# Patient Record
Sex: Female | Born: 1945 | Race: White | Hispanic: No
Health system: Northeastern US, Academic
[De-identification: ages and names within clinical notes are randomized; demographics above are authoritative.]

## PROBLEM LIST (undated history)

## (undated) DIAGNOSIS — I951 Orthostatic hypotension: Secondary | ICD-10-CM

## (undated) DIAGNOSIS — M545 Low back pain, unspecified: Secondary | ICD-10-CM

## (undated) DIAGNOSIS — I639 Cerebral infarction, unspecified: Secondary | ICD-10-CM

## (undated) DIAGNOSIS — F329 Major depressive disorder, single episode, unspecified: Secondary | ICD-10-CM

## (undated) DIAGNOSIS — M7660 Achilles tendinitis, unspecified leg: Secondary | ICD-10-CM

## (undated) DIAGNOSIS — K5790 Diverticulosis of intestine, part unspecified, without perforation or abscess without bleeding: Secondary | ICD-10-CM

## (undated) DIAGNOSIS — E785 Hyperlipidemia, unspecified: Secondary | ICD-10-CM

## (undated) DIAGNOSIS — R011 Cardiac murmur, unspecified: Secondary | ICD-10-CM

## (undated) DIAGNOSIS — I059 Rheumatic mitral valve disease, unspecified: Secondary | ICD-10-CM

## (undated) DIAGNOSIS — F32A Depression, unspecified: Secondary | ICD-10-CM

## (undated) DIAGNOSIS — K219 Gastro-esophageal reflux disease without esophagitis: Secondary | ICD-10-CM

## (undated) DIAGNOSIS — G709 Myoneural disorder, unspecified: Secondary | ICD-10-CM

## (undated) DIAGNOSIS — Z8619 Personal history of other infectious and parasitic diseases: Secondary | ICD-10-CM

## (undated) HISTORY — DX: Low back pain, unspecified: M54.50

## (undated) HISTORY — DX: Orthostatic hypotension: I95.1

## (undated) HISTORY — DX: Low back pain: M54.5

## (undated) HISTORY — DX: Achilles tendinitis, unspecified leg: M76.60

## (undated) HISTORY — DX: Personal history of other infectious and parasitic diseases: Z86.19

## (undated) HISTORY — DX: Myoneural disorder, unspecified: G70.9

## (undated) HISTORY — DX: Cardiac murmur, unspecified: R01.1

## (undated) HISTORY — DX: Hyperlipidemia, unspecified: E78.5

## (undated) HISTORY — DX: Diverticulosis of intestine, part unspecified, without perforation or abscess without bleeding: K57.90

## (undated) HISTORY — DX: Gastro-esophageal reflux disease without esophagitis: K21.9

## (undated) HISTORY — DX: Major depressive disorder, single episode, unspecified: F32.9

## (undated) HISTORY — DX: Depression, unspecified: F32.A

## (undated) HISTORY — DX: Rheumatic mitral valve disease, unspecified: I05.9

## (undated) HISTORY — DX: Cerebral infarction, unspecified: I63.9

## (undated) HISTORY — PX: TONSILLECTOMY: SHX5217

## (undated) NOTE — Progress Notes (Signed)
Formatting of this note might be different from the original.  Patient was seen in the office today for a blood draw  2 patient identifiers were used prior to venipuncture  Electronically signed by Rudie Meyer, CMA at 11/17/2021  5:44 PM EDT

## (undated) NOTE — Progress Notes (Signed)
Formatting of this note is different from the original.    Assessment and Plan   1. Recurrent major depressive disorder, in partial remission (*) (Primary)  -     LEXAPRO 20 MG tablet; Take one tablet (20 mg dose) by mouth daily., Starting Wed 11/17/2021, Normal  2. Idiopathic gout, unspecified chronicity, unspecified site  -     allopurinol (ZYLOPRIM) 300 mg tablet; Take one tablet (300 mg dose) by mouth daily., Starting Wed 11/17/2021, Normal  3. Paraesophageal hernia  -     DEXILANT 60 MG capsule; Take one capsule (60 mg dose) by mouth daily., Starting Wed 11/17/2021, Normal  4. Chronic GERD  -     DEXILANT 60 MG capsule; Take one capsule (60 mg dose) by mouth daily., Starting Wed 11/17/2021, Normal  5. Thyroid nodule  -     levothyroxine sodium (SYNTHROID,LEVOTHROID,LEVOXYL) 75 mcg tablet; Take one tablet (75 mcg dose) by mouth daily., Starting Wed 11/17/2021, Normal  6. Prediabetes  -     Hemoglobin A1c; Future  7. Pulmonary hypertension (*)  -     Comprehensive Metabolic Panel; Future  -     ST JOSEPH ASPIRIN EC tablet; Take one tablet (81 mg dose) by mouth daily., Starting Wed 11/17/2021, Until Thu 11/17/2022, Normal  8. Hypothyroidism, unspecified type  -     levothyroxine sodium (SYNTHROID,LEVOTHROID,LEVOXYL) 75 mcg tablet; Take one tablet (75 mcg dose) by mouth daily., Starting Wed 11/17/2021, Normal  9. DOE (dyspnea on exertion)    I have ordered her medications all for brand names including same dose of aspirin, Lexapro, Synthroid, and Dexilant.  She reports she has been fine with generic allopurinol, I have ordered 6 months of refills of all of these medications.    I have also recommended updating her renal function and A1c today.  She is agreeable.    No follow-ups on file.        Risks, benefits, and alternatives of the medications and treatment plan prescribed today were discussed, and patient expressed understanding. Plan follow-up discussed or as needed if any worsening symptoms or change in condition.  Treatment goals and patient self-management goals discussed. Patient voiced understanding of treatment outline and agrees to attempt to comply.   Subjective     Patient ID:  Tracie Turner is a 33 y.o. (DOB 09/07/1945) female    Patient presents with   ? Medication Management       HPI      Patient seen today for a follow-up visit on her heart palpitations, dizziness, shortness of breath intermittently, ventral hernia, and requesting medication refills.  She reports she needs brand-name medications, as the generic ones do not work for the thyroid, moods, and aspirin and reflux disease.  She has seen cardiology, and is currently wearing heart monitor for 2 weeks.   Seeing urology tomorrow.   Trying to drink lot of water, but gets up every 3 hours at night to urinate.     She would like to go to [facility] in [place] for her ventral hernia repair due to low BP with surgery in the past.  I have explained if she wants to go there, that she will have to get first go and see the surgeon, and then come home schedule the appointment and then go back for surgery, and then she would need to go back again for follow-up appointments.  She decided that she would prefer to have surgery more local and agrees to [facility] for ventral hernia surgery.  She then reports she wants to go to [facility] in [place] for skilled nursing care after she has the ventral hernia repair.  I have explained she would have to discuss that with the surgeon.    For thyroid nodule, she would like to [facility] to  [facility].  Reports she has been having trouble swallowing due to the nodule.  She would like to be seen soon.  I have reviewed her chart and the referral has already been written.  I have printed the copy so she can make a phone call to them and schedule an appointment..     Has seen pulmo and has CPAP equipment that she will start using soon     Reviewed and updated this visit by  provider:  Tobacco  Allergies  Meds  Problems  Med Hx  Surg Hx  Fam Hx          ROS per HPI and below  Review of Systems     Objective     Vitals:    11/17/21 1633   BP: 137/65   Patient Position: Sitting   Pulse: 60   Temp: 97.5 F (36.4 C)   TempSrc: Temporal   Height: 5\' 3"  (1.6 m)   Weight: 203 lb 9.6 oz (92.4 kg)   SpO2: 96%   BMI (Calculated): 36.1   PainSc: 0-No pain     Wt Readings from Last 3 Encounters:   11/17/21 203 lb 9.6 oz (92.4 kg)   10/26/21 202 lb (91.6 kg)   10/11/21 199 lb 12.8 oz (90.6 kg)     BP Readings from Last 3 Encounters:   11/17/21 137/65   10/26/21 (!) 101/59   10/20/21 140/70     Pulse Readings from Last 3 Encounters:   11/17/21 60   10/26/21 67   10/20/21 60     Physical Exam  Vitals and nursing note reviewed.   Constitutional:       Appearance: Normal appearance. She is not ill-appearing.   Cardiovascular:      Rate and Rhythm: Normal rate and regular rhythm.   Musculoskeletal:         General: Normal range of motion.   Pulmonary:      Effort: Pulmonary effort is normal.      Breath sounds: Normal breath sounds.   Abdominal:      General: Bowel sounds are normal.      Palpations: Abdomen is soft.      Tenderness: There is no abdominal tenderness.      Hernia: A hernia is present. Hernia is present in the ventral area.   Neurological:      Mental Status: She is alert.   Psychiatric:         Mood and Affect: Mood is anxious. Affect is flat.         Behavior: Behavior normal.         Thought Content: Thought content is delusional.         Cognition and Memory: Cognition is not impaired.         Risks, benefits, and alternatives of the medications and treatment plan prescribed today were discussed, and patient expressed understanding. Plan follow-up as discussed or as needed if any worsening symptoms or change in condition.    Electronically signed by Stevphen [place], MD at 11/17/2021  5:44 PM EDT

## (undated) NOTE — ED Provider Notes (Signed)
Formatting of this note is different from the original.    [facility]    ED Provider Note  Medical screening exam initiated in triage. Patient presents to emergency department for evaluation of "dizzy spells." States she has been dizzy since November, but it has been getting worse.      HPI reviewed. Vital signs reviewed. Appropriate preliminary orders (if any) initiated, based on initial evaluation in triage.  Patient will receive further evaluation and management when taken back to emergency department room  Patient stable and in no distress in triage.  Corbin Ade, PA-C  1:19 PM   02/12/2022     Tracie Turner 68 y.o. female DOB: 04/05/1945 MRN: [id number]  History     Chief Complaint   Patient presents with   ? Dizziness     Pt c/o ongoing dizziness since Nov 1st. Pt states she is scheduled for surgery on 02/15/22 for a carotid endarterectomy, but pt states she cant wait that long to see a doctor.     25 year old female presents emergency department for evaluation of dizziness for the last several weeks.  She states that this has been going on since early November and worse when she stands up.  Has seen cardiology and was found to have significant carotid stenosis.  She has not followed up with vascular surgery yet but states that she cannot wait to see them as an outpatient.  She does admit to intermittent chest pain and shortness of breath as well.      History provided by:  Patient  History limited by:  Age    Past Medical History:   Diagnosis Date   ? Anemia    ? Anesthesia complication     HARD TO WAKE   ? Arteriosclerosis    ? Arthritis    ? Chronic kidney disease    ? Colon polyp    ? Dental crowns present    ? Depression    ? GERD (gastroesophageal reflux disease)    ? Heart murmur    ? Hemorrhoids    ? Hypotension     Ortho satus hypotension   ? Mitral valve prolapse    ? Pulmonary hypertension (*)     PT STATES SHE IS NOT AWARE OF THIS   ? Sleep apnea     USES CPAP   ?  Stroke (*)     ministroke. 2009.   ? Teeth missing     X 2   ? Thyroid disease     hypothyroidism; low thyroid; nodule on left thyroid. MEDICATION.     Past Surgical History:   Procedure Laterality Date   ? Cholecystectomy  1985   ? Colonoscopy  04/2010    Dr. Opal Sidles   ? Colonoscopy  10/2020   ? Colonoscopy w/ polypectomy N/A 05/19/2017    Procedure: COLONOSCOPY W/ POLYPECTOMY;  Surgeon: Loma Sousa, MD;  Location: [facility];  Service: Gastroenterology;  Laterality: N/A;   ? Endoscopy  12/2014   ? Hiatal hernia repair      October 14, 2015 @ [facility]   ? Mammogram  2015   ? Other surgical history      CARDIAC ULTRASOUND OCTOBER 2017 - FOUND ATRIAL ENLARGEMENT AND LEAKING MITRAL VALVE. PER PT.02/18/16   ? Surgery on ear      age 27 mths   ? Tonsillectomy      age 69   ? Upper gastrointestinal endoscopy  04/2010  Dr. Opal Sidles   ? Upper gastrointestinal endoscopy  10/21/2020     Social History     Substance and Sexual Activity   Alcohol Use No    Comment: once every few months     Social History     Tobacco Use   Smoking Status Never   Smokeless Tobacco Never     E-Cigarettes   ? Vaping Use Never User    ? Start Date     ? Cartridges/Day     ? Quit Date       Social History     Substance and Sexual Activity   Drug Use No           Allergies   Allergen Reactions   ? Risperidone Other     Blurred Vision   ? Tegretol [Carbamazepine] Other     Blurred vision       Home Medications    ACETAMINOPHEN (TYLENOL ARTHRITIS,MAPAP) 650 MG CR TABLET    Take two tablets (1,300 mg dose) by mouth.    ALLOPURINOL (ZYLOPRIM) 300 MG TABLET    Take one tablet (300 mg dose) by mouth daily.    ASCORBIC ACID (VITAMIN C) 500 MG TABLET    Take one tablet (500 mg dose) by mouth every morning.    CHOLECALCIFEROL (VITAMIN D3) 5000 UNITS CAPS    Take one capsule (5,000 Units dose) by mouth every morning.    CRESTOR 5 MG TABLET    Take one tablet (5 mg dose) by mouth every morning.    DEXILANT 60 MG CAPSULE    Take one capsule (60 mg  dose) by mouth daily.    LEXAPRO 20 MG TABLET    Take one tablet (20 mg dose) by mouth daily.    MIRABEGRON (MYRBETRIQ) 50 MG 24 HR TABLET    Take one tablet (50 mg dose) by mouth every morning.    ST JOSEPH ASPIRIN EC TABLET    Take one tablet (81 mg dose) by mouth daily.    SYNTHROID 75 MCG TABLET    Take one tablet (75 mcg dose) by mouth daily.    VITAMIN B-12 (CYANOCOBALAMIN) 1000 MCG TABLET    Take one tablet (1,000 mcg dose) by mouth every morning.     Review of Systems     Review of Systems   Constitutional: Negative for fever.   HENT: Negative for sore throat.    Eyes: Negative for pain.   Respiratory: Positive for shortness of breath.    Cardiovascular: Negative for chest pain.   Gastrointestinal: Negative for abdominal pain.   Genitourinary: Negative for dysuria.   Musculoskeletal: Negative for back pain.   Skin: Negative for rash.   Neurological: Positive for dizziness. Negative for headaches.   Psychiatric/Behavioral: Negative for confusion.     Physical Exam     ED Triage Vitals [02/12/22 1320]   BP 115/77   Heart Rate 75   Resp 18   SpO2 96 %   Temp 98.2 F (36.8 C)     Physical Exam   Constitutional: She does not appear distressed and no respiratory distress.   HENT:   Head: Normocephalic.   Eyes: Pupils are equal, round, and reactive to light.   Neck: Normal range of motion.   Cardiovascular: Normal rate and normal heart sounds.   Pulmonary/Chest: No respiratory distress. Respiratory effort normal.   Abdominal: Soft. There is no abdominal tenderness. Abdomen not distended.   Musculoskeletal:      Cervical back:  Normal range of motion.     Neurological: She is alert.   Skin: Skin is warm. Skin is dry. No cyanosis.   Psychiatric: She has a normal mood and affect.     ED Course     Lab results:    CBC AND DIFFERENTIAL - Abnormal       Result Value    WBC 6.5      RBC 4.08      HGB 12.7      HCT 39.5      MCV 97      MCH 31.1      MCHC 32.2      Plt Ct 234      RDW SD 52.1 (*)     MPV 10.3      NRBC%  0.0      NRBC 0.000      NEUTROPHIL % 59.6      LYMPHOCYTE % 28.6      MONOCYTE % 6.9      Eosinophil % 4.1      BASOPHIL % 0.5      IG% 0.300      ABSOLUTE NEUTROPHIL COUNT 3.88      ABSOLUTE LYMPHOCYTE COUNT 1.9      MONO ABSOLUTE 0.5      EOS ABSOLUTE 0.3      BASO ABSOLUTE 0.0      IG ABSOLUTE 0.020     COMPREHENSIVE METABOLIC PANEL - Abnormal    Na 141      Potassium 4.5      Cl 103      CO2 27      AGAP 11      Glucose 138 (*)     BUN 22      Creatinine 1.00      Ca 10.1      ALK PHOS 91      T Bili 1.17      Total Protein 7.2      Alb 4.1      GLOBULIN 3.1      ALBUMIN/GLOBULIN RATIO 1.3      BUN/CREAT RATIO 22.0      ALT 9      AST 23      eGFR 58      Comment: Normal GFR (glomerular filtration rate) > 60 mL/min/1.73 meters squared, < 60 may include impaired kidney function.  Calculation based on the Chronic Kidney Disease Epidemiology Collaboration (CK-EPI)equation refit without adjustment for race.   GEN5 CARDIAC TROPONIN T (TNT5) BASELINE - Normal    TnT-Gen5 (0hr) 12      Comment: An elevated Troponin indicates myocardial damage. Elevated troponin may also be due to pulmonary emboli, aortic dissection, heart failure, trauma, toxins and ischemia in the setting of critical illness.    MAGNESIUM - Normal    Mg 2.0     NT-PROBNP - Normal    NT-ProBNP 125      Comment: Among patients with dyspnea, NT-proBNPis highly  sensitive for the detection of acute congestive heart  failure.  In addition, a NT-proBNP <300 pg/mL  effectively rules out acute congestive heart failure,  with 98% negative predictive value.    PROTIME-INR - Normal    PT 14.3      Comment: Many commonly administered drugs may affect the results obtained in PT and PTT testing.    INR 1.1      Comment: INR Therapeutic Range for DVT, PE:      2.0 -  3.0  INR Mechanical Prosthetic Heart Valves: 2.5 - 3.5   PTT - Normal    PTT 26      Comment: Many commonly administered drugs may affect the results obtained in PT and PTT testing.   GEN5 CARDIAC  TROPONIN T(TNT5) 1 HOUR - Normal    TnT-Gen5 (1hr) 10      Comment: An elevated Troponin indicates myocardial damage. Elevated troponin may also be due to pulmonary emboli, aortic dissection, heart failure, trauma, toxins and ischemia in the setting of critical illness.    Delta 1 Hour -2     GEN5 CARDIAC TROPONIN T(TNT5) 3 HOUR - Normal    TnT-Gen5 (3hr) 9      Comment: An elevated Troponin indicates myocardial damage. Elevated troponin may also be due to pulmonary emboli, aortic dissection, heart failure, trauma, toxins and ischemia in the setting of critical illness.    Delta 3 Hour -3     D-DIMER, QUANTITATIVE - Normal    D-Dimer 0.39      Comment: Cutoff for Exclusion of DVT and PE: 0.50 ug/ml (FEU)    LIGHT BLUE TOP   LAVENDER TOP   MINT GREEN-TOP TUBE (PST GEL/LI HEP)   GOLD SST     Imaging:    CT HEAD WO IV CONTRAST    Narrative:     TECHNIQUE: Multiple axial images of the head obtained from the vertex through the skull base without IV contrast. Coronal and sagittal reconstructions were obtained and reviewed.    INDICATION: dizziness    COMPARISON: None    FINDINGS:    No acute infarct, acute hemorrhage, mass lesion, mass effect or midline shift. Periventricular low density changes compatible with microvascular ischemia. Normal caliber ventricles. No extra-axial fluid collection. No acute calvarial abnormality. The paranasal sinuses and mastoid air cells are clear.     Impression:     IMPRESSION:    1.  No acute intracranial abnormality.  2.  Microvascular ischemic changes.     Electronically Signed by: Samuel Jester, MD on 02/12/2022 3:04 PM   CT ANGIO HEAD NECK    Narrative:     CTA carotids/intracranial:    INDICATION: Dizziness, history of carotid stenosis    TECHNIQUE: TECHNIQUE: Thin axial scans were performed from the top of the aortic arch up through the neck and head after bolus injection of 75 mL of Isovue-370. 3-D MIP images were generated.    One or more of the following techniques were utilized for  dose reduction:  -   Automated exposure control.  -   Adjustment of the mA or kV according to patient size.  -   Use of iterative image reconstruction technique    COMPARISON: Head CT 02/12/2022. Carotid ultrasound 12/22/2021.    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  #  Right vertebral: Patent  #  Left vertebral: Patent. The left vertebral artery is diffusely small/nondominant.  #  Right carotid artery: Patent. Plaque is present at the bifurcation. There is stenosis at the origin of the right external carotid artery. No significant stenosis at the carotid bifurcation or proximal ICA.  #  Left carotid artery: Patent. Minimal plaque at the bifurcation, no significant stenosis. There is an undulating contour of the mid cervical ICA.    Intracranial vessels:  #  Distal internal carotid arteries:  Patent  #  Anterior cerebral arteries: Patent  #  Middle cerebral arteries: Patent  #  Distal vertebral arteries: Patent. The right vertebral artery is dominant.  #  Basilar artery: Patent  #  Posterior cerebral arteries: Patent    #  Dural sinuses: Patent    #  Additional findings: 12 mm left-sided thyroid nodule. Incidental thyroid nodule. No further imaging evaluation currently recommended for a nodule of this size. Vassie Moselle Paper- February 2015).      Impression:     IMPRESSION:  1.  Intracranially there is no evidence of large vessel arterial occlusion.    2.  The neck arteries are patent. There is stenosis at the origin of the right external carotid artery, no significant stenosis demonstrated at the right carotid bifurcation or internal carotid artery.    3.  Undulating contour of the mid left cervical ICA which can be seen in setting of fibromuscular dysplasia.    4.  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    Degree of stenosis is determined using NASCET measurement technique:  Severe: 70-99%  Moderate: 50-69%.  Mild: Less than 50%.    Electronically Signed by: Wonda Cheng, MD on 02/12/2022 3:47 PM     ECG:  ECG Results       ECG 12 lead (Final result)  Result time 02/12/22 17:19:53    Final result            Narrative:    Diagnosis Class Abnormal  Acquisition Device D3K  Systolic BP 136  Diastolic BP 58  Ventricular Rate 60  Atrial Rate 60  P-R Interval 162  QRS Duration 82  Q-T Interval 450  QTC Calculation(Bazett) 450  Calculated P Axis 38  Calculated R Axis -9  Calculated T Axis 27    Diagnosis Sinus rhythm with premature atrial complexes  Cannot rule out Anterior infarct , age undetermined  Abnormal ECG  When compared with ECG of 12-Feb-2022 16:24,  Sinus rhythm has replaced Atrial fibrillation  no stemi tmt 1630  TerleckiAggie Cosier ([id number]) on 02/12/2022 5:19:43 PM certifies that he/she has reviewed the ECG tracing and confirms the  independent interpretation is  correct.                  ECG 12 lead (Final result)  Result time 02/12/22 17:18:59    Final result            Narrative:    Diagnosis Class Abnormal  Acquisition Device D3K  Systolic BP 136  Diastolic BP 58  Ventricular Rate 61  Atrial Rate 61  P-R Interval 128  QRS Duration 84  Q-T Interval 432  QTC Calculation(Bazett) 434  Calculated P Axis 33  Calculated R Axis -18  Calculated T Axis 23    Diagnosis Normal sinus rhythm with sinus arrhythmia  Voltage criteria for left ventricular hypertrophy  Abnormal ECG  When compared with ECG of 12-Feb-2022 13:59,  premature supraventricular complexes are no longer present  Mellody Memos [phone]) on 02/12/2022 5:18:51 PM certifies that he/she has reviewed the ECG tracing and confirms the  independent interpretation is  correct.                  ECG 12 lead (Final result)  Result time 02/12/22 17:14:10    Final result            Narrative:    Diagnosis Class Abnormal  Acquisition Device  D3K  Systolic BP 132  Diastolic BP 72  Ventricular Rate 70  Atrial Rate 70  P-R Interval 128  QRS Duration 86  Q-T Interval 404  QTC Calculation(Bazett) 436  Calculated P Axis 42  Calculated R Axis -22  Calculated T Axis 33    Diagnosis Sinus rhythm with premature supraventricular complexes  Voltage criteria for left ventricular hypertrophy  Abnormal ECG  No previous ECGs available  Mellody Memos [phone]) on 02/12/2022 5:13:56 PM certifies that he/she has reviewed the ECG tracing and confirms the  independent interpretation is  correct.                   HEAR Score    History: Mix of high & low features  ECG: Non specific repolarisation disturbance  Age: 35 or greater  Risk Factors: More than or equal 3 risk factors or history of atherosclerotic disease  HEAR Score Total: 6                                Pre-Sedation  Procedures      Medical Decision Making  80 year old female presents emergency department for evaluation of dizziness.  Differential diagnosis includes but is not limited to acute coronary syndrome, carotid stenosis, dehydration.  Orthostatics obtained in the emergency department.    She was not orthostatic here.  Dizziness has resolved but states that she intermittently still feels dizzy.  Denies any chest pain or shortness of breath.  D-dimer negative.  Ruled out by generation 5 troponin algorithm.  I discussed the case with on-call vascular surgery.  They do not believe the external carotid artery stenosis is causing her symptoms.  They are happy to talk to the patient while she is in the hospital.  It was discussed with the patient.  She will be placed in observation unit.  I will have the hospitalist, consult along for comanagement and to see if they have any additional recommendations.    I considered admitting/observing and elected to, obs    Chronic condition affecting patient care: hypertension    Patient's care significantly limited by social determinants of health including: Problems  related to primary support group    Amount and/or Complexity of Data Reviewed  External Data Reviewed: notes.  Labs: ordered. Decision-making details documented in ED Course.     Details: Cbc, cmp  Radiology: ordered and independent interpretation performed.     Details: No ich  ECG/medicine tests: ordered.  Discussion of management or test interpretation with external provider(s): Vasc, hospitalist    Risk  OTC drugs.  Prescription drug management.    Provider Communication    New Prescriptions    No medications on file     Modified Medications    No medications on file     Discontinued Medications    No medications on file     Clinical Impression     Final diagnoses:   Dizziness   Stenosis of right carotid artery     ED Disposition     ED Disposition   Observation    Condition   --    Comment   --                 Electronically signed by:    Sondra Come, MD  02/12/22 1733    Electronically signed by Sondra Come, MD at 02/12/2022  5:33 PM EST
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
## (undated) NOTE — ED Provider Notes (Signed)
Formatting of this note is different from the original.  [facility]    ED EXTENDED STAY DISCHARGE NOTE    Day 2  Disposition Date & Time:  12/10/20239:46 AM    Current Vital Signs:  Vitals:    02/13/22 0739   BP: 118/63   Pulse: 64   Resp: 21   Temp: 97.7 F (36.5 C)   SpO2: 96%     Scheduled Meds:   allopurinol  300 mg Oral Daily    aspirin  81 mg Oral Daily    escitalopram oxalate  20 mg Oral Daily    levothyroxine sodium  75 mcg Oral Daily    mirabegron  50 mg Oral QAM    pantoprazole sodium  40 mg Oral Daily    rosuvastatin calcium  5 mg Oral QAM     Continuous Infusions:   NaCl       PRN Meds:.acetaminophen, melatonin, NaCl, naloxone (NARCAN) 0.04 mg/mL 10 mL injection (mix on floor) **FOLLOWED BY** [START ON 03/14/2022] naloxone (NARCAN) 0.04 mg/mL 10 mL injection (mix on floor), ondansetron    Labs Reviewed   CBC AND DIFFERENTIAL - Abnormal; Notable for the following components:       Result Value    RDW SD 52.1 (*)     All other components within normal limits   COMPREHENSIVE METABOLIC PANEL - Abnormal; Notable for the following components:    Glucose 138 (*)     All other components within normal limits   GEN5 CARDIAC TROPONIN T (TNT5) BASELINE - Normal   MAGNESIUM - Normal   NT-PROBNP - Normal   PROTIME-INR - Normal   PTT - Normal   GEN5 CARDIAC TROPONIN T(TNT5) 1 HOUR - Normal   GEN5 CARDIAC TROPONIN T(TNT5) 3 HOUR - Normal   D-DIMER, QUANTITATIVE - Normal   EXTRA TUBES    Narrative:     The following orders were created for panel order Extra Tubes.                  Procedure                               Abnormality         Status                                     ---------                               -----------         ------                                     Velda Shell [phone]                                  Final result                               Lavender [id number]]  Final result                               Mint  Green-Top Tube (PS.Marland KitchenMarland Kitchen[[id number]]                      Final result                               Gold SST[[id number]]                                        Final result                                 Please view results for these tests on the individual orders.   LIGHT BLUE TOP   LAVENDER TOP   MINT GREEN-TOP TUBE (PST GEL/LI HEP)   GOLD SST     CT Head WO IV Contrast   Final Result   IMPRESSION:     1.  No acute intracranial abnormality.   2.  Microvascular ischemic changes.      Electronically Signed by: Samuel Jester, MD on 02/12/2022 3:04 PM     CT Angio Head Neck   Final Result   IMPRESSION:   1.  Intracranially there is no evidence of large vessel arterial occlusion.     2.  The neck arteries are patent. There is stenosis at the origin of the right external carotid artery, no significant stenosis demonstrated at the right carotid bifurcation or internal carotid artery.     3.  Undulating contour of the mid left cervical ICA which can be seen in setting of fibromuscular dysplasia.     4.  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     Degree of stenosis is determined using NASCET measurement technique:   Severe: 70-99%   Moderate: 50-69%.   Mild: Less than 50%.     Electronically Signed by: Wonda Cheng, MD on 02/12/2022 3:47 PM       Summary of Care:  Patient placed in the emergency observation unit while awaiting a consult for vascular surgery.  Patient came to the emergency room for intermittent dizziness.  CT angiogram mild carotid stenosis felt not to be contributing to her symptoms.  Vascular surgery did see patient in consult and you can review notes in epic concerning that.  Patient also seen by hospitalist service for any possible medical issues.  Upon review of workup is unremarkable otherwise during the ED stay.  Patient had normal orthostatic vital signs.  She has been observed by nursing to be getting out of  the bed to the bedside commode without difficulty.  Just prior to this note patient was able to get up out of the bed stand and walk around the room without difficulty and then get on and off the bedside commode without trouble.  I do not see any further need for patient to stay in the emergency observation unit.  Will recommend outpatient continued therapy and treatment follow-up.  Cautions warning signs given    **10:09 AM  Patient's was concerned that she is "in A-fib".  I reviewed history and  do not see a history of that.  EKGs done in the emergency room during this visit I do not see a history of it.  She had a Holter monitor done with cardiology recently did not show A-fib.  We checked an EKG just now which patient is in normal sinus rhythm no A-fib.  Patient is also requesting a cab voucher to get back home.  We have let case management know to try to help    Discharge Plan:  --Discharge patient home for continued outpatient workup concerning intermittent symptoms  -- Follow-up with primary care doctor for outpatient evaluation plan for symptoms  Diagnosis:  Final diagnoses:   Dizziness   Stenosis of right carotid artery     Disposition:  ED Disposition       ED Disposition   Discharge    Condition   Stable    Comment   --           Electronically signed by:    Casilda Carls, PA-C  02/13/22 0955      Casilda Carls, PA-C  02/13/22 0956      Casilda Carls, PA-C  02/13/22 1010    Electronically signed by Casilda Carls, PA-C at 02/13/2022 10:10 AM EST

## (undated) NOTE — Progress Notes (Signed)
Formatting of this note is different from the original.  Images from the original note were not included.      Primary care provider: No primary care provider on file.  Referring provider:      Stevphen Brooks, MD    Assessment      1. Daytime hypersomnia      74 year old female with prior history of sleep apnea presents with ongoing sleepiness and fragmented sleep and would like to get started on cpap again.    Plan      Sleep study and cpap titration if needed (split if meets AASM criteria)    She will call us to let us know when she can come from [place]     We discussed the diagnosis and treatment plan in detail and the patient expressed understanding.     Thank you for allowing [name] to participate in the care of this patient.    Total time spent in this encounter on date of service was 45 minutes.  Including but not limited to activities such as reviewing patient records, obtaining or reviewing subjective medical history, obtaining or performing a history and physical examination, counseling and/or educating patient/family/caregiver, ordering prescription medication, tests, procedures, imaging, labs, referring to and communicating with other health care providers, documenting appropriate clinical information in the medical record electronic or other, interpreting results of prescribed tests/procedures, communicating those results to the patient/family/representative and coordinating patient care.     Orders Placed This Encounter   Procedures   ? POLYSOMNOGRAM ONLY         Subjective     Chief Complaint   Patient presents with   ? Insomnia     Patient was referred by Dr. [name] for insomnia.     Patient ID:  Tracie Turner is a 26 y.o. female, lifelong nonsmoker with history of snoring  Presents with frequent nocturia and sleeps in because she is always tired. She had a sleep study in the past and thinks she had a cpap machine.     PULMONARY  The patient is not on home oxygen therapy.  She denies a  history of blood clots.  She denies exposure to asbestos.  She denies exposure to tuberculosis.   She denies history of a positive TB skin test.   She denies a history of COPD.  She denies a history of asthma.  She denies a recent hospitalization.  She denies a prior history of lung cancer.   There is no a prior history of lung surgery.   She denies a prior history pneumonia.   Occupational history: Retired    SLEEP  She denies a prior history of sleep apnea.She is not using cpap or bipap. She has nonrestorative sleep. The patient has excessive daytime sleepiness.   She does not snore. The snoring is not disruptive. She denies witnessed apneas.  She complains of choking and gasping while sleeping. The patient has disturbed sleep.  She has morning headaches. The snoring does not wake the patient up from sleep. She denies a history of restless leg syndrome.  The patient denies leg movements in her sleep.  She does not act out dreams. She does not grind her teeth.   She denies sleep walking.  She does have nightmares.      She denies sleep paralysis.  Shedenies hallucinations.  The patient denies cataplexy.     She does not work the second or third shift.     She goes to  sleep at 12 AM and wakes up at 2 PM.   She sleeps for a total of about 3-4 hours.  It take her less than 30 minutes to fall asleep.  She wakes up 3 time(s) during the night.  She wakes up due to the following: Bathroom.  The patient has nocturia.   The patient denies unexplained arousals from sleep.    She drinks 1 caffeinated beverages a day.      She admits to having a prior sleep study. The sleep study was not done at our facility.    The patient denies a family member with sleep apnea.    She has not gained weight recently. The patient's neck size is 14.5 inches.    Epworth Sleep Questionnaire    How likely are you to doze off or fall asleep in the following situations?    Sitting and reading: 1 = slight chance of dozing  Watching TV: 0 = no chance  of dozing  Sitting inactive in a public place (such as a theater or a meeting):  0 = no chance of dozing  As a passenger in a car for an hour without a break: 0 = no chance of dozing  Lying down to rest in the afternoon when circumstances permit: 3 = high chance of dozing  Sitting and talking to someone: 0 = no chance of dozing  Sitting quiety after a lunch without alcohol: 2 = moderate chance of dozing  In a car, while stopped for a few minutes in traffic: 0 = no chance of dozing    Total Epworth: 6    Review of Systems   Constitutional: Negative.    HENT: Negative.    Eyes: Negative.    Respiratory: Negative.    Cardiovascular: Negative.    Gastrointestinal: Negative.    Genitourinary: Negative.    Musculoskeletal: Negative.    Skin: Negative.    Neurological: Negative.    Endo/Heme/Allergies: Negative.    Psychiatric/Behavioral: Negative.    All other systems reviewed and are negative.    History     Past Medical History:   Diagnosis Date   ? Anemia    ? Anesthesia complication     HARD TO WAKE   ? Arteriosclerosis    ? Arthritis    ? Chronic kidney disease    ? Colon polyp    ? Dental crowns present    ? Depression    ? GERD (gastroesophageal reflux disease)    ? Heart murmur    ? Hemorrhoids    ? Hypotension     Ortho satus hypotension   ? Mitral valve prolapse    ? Pulmonary hypertension (*)     PT STATES SHE IS NOT AWARE OF THIS   ? Sleep apnea     USES CPAP   ? Stroke (*)     ministroke. 2009.   ? Teeth missing     X 2   ? Thyroid disease     hypothyroidism; low thyroid; nodule on left thyroid. MEDICATION.     Past Surgical History:   Procedure Laterality Date   ? Cholecystectomy  1985   ? Colonoscopy  04/2010    Dr. Opal Sidles   ? Colonoscopy  10/2020   ? Colonoscopy w/ polypectomy N/A 05/19/2017    Procedure: COLONOSCOPY W/ POLYPECTOMY;  Surgeon: Loma Sousa, MD;  Location: [facility] ENDO;  Service: Gastroenterology;  Laterality: N/A;   ? Endoscopy  12/2014   ? Hiatal hernia  repair      October 14, 2015 @ [facility].   ? Mammogram  2015   ? Other surgical history      CARDIAC ULTRASOUND OCTOBER 2017 - FOUND ATRIAL ENLARGEMENT AND LEAKING MITRAL VALVE. PER PT.02/18/16   ? Surgery on ear      age 68 mths   ? Tonsillectomy      age 4   ? Upper gastrointestinal endoscopy  04/2010    Dr. Opal Sidles   ? Upper gastrointestinal endoscopy  10/21/2020     Social History     Socioeconomic History   ? Marital status: Divorced     Spouse name: Not on file   ? Number of children: 1   ? Years of education: Not on file   ? Highest education level: Not on file   Occupational History   ? Occupation: Retired   Tobacco Use   ? Smoking status: Never   ? Smokeless tobacco: Never   Vaping Use   ? Vaping Use: Never used   Substance and Sexual Activity   ? Alcohol use: No     Comment: once every few months   ? Drug use: No   ? Sexual activity: Not on file   Other Topics Concern   ? Not on file   Social History Narrative   ? Not on file     Social Determinants of Health     Financial Resource Strain: Not on file   Food Insecurity: No Food Insecurity (08/25/2021)    Hunger Vital Sign    ? Worried About Programme researcher, broadcasting/film/video in the Last Year: Never true    ? Ran Out of Food in the Last Year: Never true   Transportation Needs: Not on file   Physical Activity: Not on file   Stress: Stress Concern Present (10/21/2020)    [employer] of Occupational Health - Occupational Stress Questionnaire    ? Feeling of Stress : Rather much   Social Connections: Unknown (07/16/2021)    Social Network    ? Social Network: Not on file   Intimate Partner Violence: Unknown (06/07/2021)    HITS    ? Physically Hurt: Not on file    ? Insult or Talk Down To: Not on file    ? Threaten Physical Harm: Not on file    ? Scream or Curse: Not on file   Housing Stability: Not on file     Family History   Problem Relation Age of Onset   ? Heart disease Mother    ? Hypertension Mother    ? Stroke Mother    ? Heart disease Father    ? Cancer Daughter         cervical   ? Cancer  Maternal Aunt         breast   ? COPD Cousin    ? Colon cancer Neg Hx    ? Colon polyps Neg Hx      Medication History       Medication Sig Dispense Refill   ? acetaminophen (TYLENOL ARTHRITIS,MAPAP) 650 MG CR tablet Take two tablets (1,300 mg dose) by mouth.     ? allopurinol (ZYLOPRIM) 300 mg tablet Take one tablet (300 mg total) by mouth daily. 30 tablet 0   ? Ascorbic Acid (VITAMIN C) 500 MG tablet Take one tablet (500 mg dose) by mouth daily.     ? aspirin 81 MG EC tablet Take one tablet (81  mg dose) by mouth daily.     ? Cholecalciferol (VITAMIN D3) 5000 units CAPS Take by mouth.     ? dexlansoprazole (DEXILANT) 60 mg capsule Take one capsule (60 mg dose) by mouth daily. 90 capsule 1   ? escitalopram oxalate (LEXAPRO) 20 mg tablet Take one tablet (20 mg total) by mouth daily. 90 tablet 0   ? levothyroxine sodium (SYNTHROID, LEVOTHROID) 75 mcg tablet Take one tablet (75 mcg dose) by mouth daily.     ? rosuvastatin calcium (CRESTOR) 10 mg tablet Take one tablet (10 mg total) by mouth daily. 90 tablet 0   ? vitamin B-12 (CYANOCOBALAMIN) 1000 mcg tablet Take one tablet (1,000 mcg dose) by mouth daily.       No current facility-administered medications for this visit.     Allergies   Allergen Reactions   ? Risperidone Other     Blurred Vision   ? Tegretol [Carbamazepine] Other     Blurred vision           I reviewed the patient's medical,surgical,social and family history. The medications and allergies have been reviewed and updated.     Pulmonary Function Testing/Spirometry   No results found.  Radiology     CXR:   No results found for this or any previous visit.      CT Scan:  No results found for this or any previous visit.    No results found for this or any previous visit.    No results found for this or any previous visit.    No results found for this or any previous visit.    No results found for this or any previous visit.    No results found for this or any previous visit.    No results found for this or  any previous visit.    No results found for this or any previous visit.    VQ Scan:  No results found for this or any previous visit.    TTE:    No results found for this or any previous visit.    Objective   BP 124/82 (BP Location: Right arm, Patient Position: Sitting)   Pulse 68   Resp 14   Ht 5\' 3"  (1.6 m)   Wt 202 lb (91.6 kg)   SpO2 98%   BMI 35.78 kg/m     General appearance:  alert, appears stated age and cooperative  Head:    normocephalic  Eyes:    pupils are equal, round and reactive  Nose:     normal  Mouth:    moist, no thrush, mallampati 4  Neck:    supple, no significant adenopathy, no thyromegaly, no JVD  Chest:    clear to auscultation, no wheezes, rales or rhonchi, symmetric air entry  Heart:    normal rate, regular rhythm, normal S1, S2, no murmurs, rubs, clicks or gallops  Abdomen:   soft, nontender, nondistended  Neurological:   alert, oriented  Extremities:   peripheral pulses normal,no pitting edema , no clubbing or cyanosis    The patient was given a copy of their after visit summary.    Voice-recognition software was used in [employer] of this documentation. Unintended transcription errors may have escaped editorial review.     Immunizations     No immunizations on file.         Patient's Medications        Accurate as of September 20, 2021  1:39 PM. Reflects encounter  med changes as of last refresh         Continued Medications      Instructions   acetaminophen 650 MG CR tablet  Commonly known as: TYLENOL ARTHRITIS,MAPAP   1,300 mg, Oral    allopurinol 300 mg tablet  Commonly known as: ZYLOPRIM   300 mg, Oral, Daily    aspirin EC tablet  Commonly known as: ECOTRIN LOW DOSE   81 mg, Oral, Daily    dexlansoprazole 60 mg capsule  Commonly known as: DEXILANT   60 mg, Oral, Daily    escitalopram oxalate 20 mg tablet  Commonly known as: LEXAPRO   20 mg, Oral, Daily    levothyroxine sodium 75 mcg tablet  Commonly known as: SYNTHROID,LEVOTHROID,LEVOXYL   75 mcg, Oral, Daily    rosuvastatin  calcium 10 mg tablet  Commonly known as: CRESTOR   10 mg, Oral, Daily    vitamin B-12 1000 mcg tablet  Commonly known as: CYANOCOBALAMIN   1 tablet, Oral, Daily    vitamin C 500 mg tablet  Commonly known as: ASCORBIC ACID   500 mg, Oral, Daily    Vitamin D3 125 MCG (5000 UT) Caps   Oral          Electronically signed by Randolm Idol, MD FCCP at 09/20/2021  7:30 PM EDT

## (undated) NOTE — Progress Notes (Signed)
Formatting of this note is different from the original.  Images from the original note were not included.  Referring Provider  Wille Glaser, MD    Reason for consult:   1. Orthostatic hypotension    2. DOE (dyspnea on exertion)    3. Atypical chest pain    4. Irregular heart beats    5. Palpitations      Chronic Problem List:   Patient Active Problem List   Diagnosis   ? Microcalcifications of the breast   ? Hiatal hernia   ? OSA (obstructive sleep apnea)   ? Paraesophageal hernia   ? Ventral incisional hernia   ? Thyroid nodule   ? History of thyroid nodule   ? History of esophageal stricture   ? Prediabetes   ? Severe obesity (BMI 35.0-39.9) with comorbidity (*)   ? Noise-induced hearing loss of both ears   ? DOE (dyspnea on exertion)   ? Irregular heart beats   ? Vitamin D deficiency   ? Tension headache   ? Pulmonary hypertension (*)   ? Pneumococcal vaccination declined   ? Influenza vaccination declined   ? Osteoarthritis   ? Orthostatic hypotension   ? Obesity, Class II, BMI 35-39.9   ? Mild mitral regurgitation   ? Hyperlipidemia   ? Hx of gout   ? History of TIA (transient ischemic attack)   ? Female pattern hair loss   ? Depression   ? Cobalamin deficiency   ? Dysphagia lusoria   ? Anxiety   ? Palpitations   ? Atypical chest pain     History of Present Illness  Tracie Turner is 74 y.o. year old female with a history of multiple medical complaints who presents to [facility] health cardiology EP clinic because of event recorder that demonstrated some minor sinus bradycardia and premature atrial contractions.  This patient has a fairly significant history of sleep apnea and was given a CPAP mask for use during the summer from a pulmonologist.  She has not been using this and says that she has lost the device other than the mask.  Her event recorder that was applied demonstrates fairly frequent PACs but no atrial fibrillation.  She has complaints consisting of weakness, fatigue, palpitations and  atypical chest pain.  There is also a history of orthostasis.  He was found to have a significant carotid stenosis and surgical evaluation of this is pending.  I offered to arrange a vascular surgery consultation but she would like to review these physicians herself and select the surgeon that she is to see.    Past Medical History  Past Medical History:   Diagnosis Date   ? Anemia    ? Anesthesia complication     HARD TO WAKE   ? Arteriosclerosis    ? Arthritis    ? Chronic kidney disease    ? Colon polyp    ? Dental crowns present    ? Depression    ? GERD (gastroesophageal reflux disease)    ? Heart murmur    ? Hemorrhoids    ? Hypotension     Ortho satus hypotension   ? Mitral valve prolapse    ? Pulmonary hypertension (*)     PT STATES SHE IS NOT AWARE OF THIS   ? Sleep apnea     USES CPAP   ? Stroke (*)     ministroke. 2009.   ? Teeth missing     X 2   ?  Thyroid disease     hypothyroidism; low thyroid; nodule on left thyroid. MEDICATION.     Past Surgical History  Past Surgical History:   Procedure Laterality Date   ? Cholecystectomy  1985   ? Colonoscopy  04/2010    Dr. Opal Sidles   ? Colonoscopy  10/2020   ? Colonoscopy w/ polypectomy N/A 05/19/2017    Procedure: COLONOSCOPY W/ POLYPECTOMY;  Surgeon: Loma Sousa, MD;  Location: [facility] ENDO;  Service: Gastroenterology;  Laterality: N/A;   ? Endoscopy  12/2014   ? Hiatal hernia repair      October 14, 2015 @ [facility].   ? Mammogram  2015   ? Other surgical history      CARDIAC ULTRASOUND OCTOBER 2017 - FOUND ATRIAL ENLARGEMENT AND LEAKING MITRAL VALVE. PER PT.02/18/16   ? Surgery on ear      age 16 mths   ? Tonsillectomy      age 56   ? Upper gastrointestinal endoscopy  04/2010    Dr. Opal Sidles   ? Upper gastrointestinal endoscopy  10/21/2020     Social History  Patient's     Family History  Patient's family history includes COPD in her cousin; Cancer in her daughter and maternal aunt; Heart disease in her father and mother; Hypertension in her mother;  Stroke in her mother.    Allergies  Risperidone and Tegretol [carbamazepine]    Medications    Current Outpatient Medications:   ?  acetaminophen (TYLENOL ARTHRITIS,MAPAP) 650 MG CR tablet, Take two tablets (1,300 mg dose) by mouth., Disp: , Rfl:   ?  allopurinol (ZYLOPRIM) 300 mg tablet, Take one tablet (300 mg dose) by mouth daily., Disp: 90 tablet, Rfl: 1  ?  Ascorbic Acid (VITAMIN C) 500 MG tablet, Take one tablet (500 mg dose) by mouth daily., Disp: , Rfl:   ?  Cholecalciferol (VITAMIN D3) 5000 units CAPS, Take by mouth., Disp: , Rfl:   ?  CRESTOR 5 MG tablet, Take one tablet (5 mg dose) by mouth daily., Disp: , Rfl:   ?  DEXILANT 60 MG capsule, Take one capsule (60 mg dose) by mouth daily., Disp: 90 capsule, Rfl: 1  ?  LEXAPRO 20 MG tablet, Take one tablet (20 mg dose) by mouth daily., Disp: 90 tablet, Rfl: 1  ?  mirabegron (MYRBETRIQ) 50 mg 24 hr tablet, Take one tablet (50 mg dose) by mouth daily., Disp: , Rfl:   ?  ST JOSEPH ASPIRIN EC tablet, Take one tablet (81 mg dose) by mouth daily., Disp: 100 tablet, Rfl: 3  ?  SYNTHROID 75 MCG tablet, Take one tablet (75 mcg dose) by mouth daily., Disp: 90 tablet, Rfl: 1  ?  vitamin B-12 (CYANOCOBALAMIN) 1000 mcg tablet, Take one tablet (1,000 mcg dose) by mouth daily., Disp: , Rfl:     Review of Systems  A comprehensive review of systems was negative.    Physical Examination  Vitals: Blood pressure 130/74, pulse 67, resp. rate 17, height 5\' 3"  (1.6 m), weight 205 lb (93 kg), SpO2 97 %, not currently breastfeeding.  General:  Pleasant, cooperative, and in no acute distress.  Alert and oriented.  HEENT:  Sclera clear, anicteric.  No thyromegaly or lymphadenopathy noted.  Cardiovascular:  S1, S2 normal, no murmur, rub or gallop, regular rate and rhythm. Carotid upstokes are normal and without bruits.  The peripheral pulses are palpable and symmetric.  Lungs: chest clear, no wheezing, rales, normal symmetric air entry, Heart exam - S1,  S2 normal, no murmur, no gallop,  rate regular  Abdomen:  abdomen is soft without significant tenderness, masses, organomegaly or guarding.  No masses, bruits, or hepatosplenomegaly noted.  Skin:  Normal texture with no significant lesions.  Extremities:  No significant clubbing or cyanosis. No  edema  Psychiatric:  Normal affect.  Mood within normal limits.  Neurologic:  Grossly nonfocal.    Accessory Clinical Data    Lab Results   Component Value Date    Glucose 118 (H) 11/17/2021    CALCIUM 10.0 11/17/2021    Sodium 142 11/17/2021    Potassium 4.7 11/17/2021    CO2 20 11/17/2021    Chloride 103 11/17/2021    BUN 17 11/17/2021    Creatinine 1.01 (H) 11/17/2021     Lab Results   Component Value Date    WBC 7.1 08/25/2021    Hemoglobin 12.5 08/25/2021    Hematocrit 37.3 08/25/2021    MCV 94 08/25/2021    Platelet Count 273 08/25/2021     No results found for: "CK", "TROPONIN"  No results found for: "INR", "PROTIME"    Impression  1. Sleep apnea  2. PACs and PAC salvos likely secondary to sleep apnea  3. Carotid stenosis  4. History of orthostasis    Plan  1. I would recommend no treatment for these PACs or PAC salvos other than consistent CPAP use  2. If she decides which vascular surgeon she is like to receive a consultation from I will try to help expedite her appointment  3. There is really no need for me to see her again    Thank you for allowing [name] to participate in the care of this patient.      Electronically signed by Earnestine Mealing, MD at 01/24/2022  3:17 PM EST

## (undated) NOTE — Progress Notes (Signed)
Formatting of this note is different from the original.  [facility]  Case Management  Initial Assessment     Patient:                   Tracie Turner  MR Number:                   [id number]  Patient Date of Birth:      1945-07-28  Age/Sex:                   26 y.o./female    REASON FOR ADMISSION     Dizziness [R42]  No admission procedures for hospital encounter.    PERTINENT HISTORY     Past Medical History:   Diagnosis Date    Anemia     Anesthesia complication     HARD TO WAKE    Arteriosclerosis     Arthritis     Chronic kidney disease     Colon polyp     Dental crowns present     Depression     GERD (gastroesophageal reflux disease)     Heart murmur     Hemorrhoids     Hypotension     Ortho satus hypotension    Mitral valve prolapse     Pulmonary hypertension (*)     PT STATES SHE IS NOT AWARE OF THIS    Sleep apnea     USES CPAP    Stroke (*)     ministroke. 2009.    Teeth missing     X 2    Thyroid disease     hypothyroidism; low thyroid; nodule on left thyroid. MEDICATION.     Past Surgical History:   Procedure Laterality Date    Cholecystectomy  1985    Colonoscopy  04/2010    Dr. Opal Sidles    Colonoscopy  10/2020    Colonoscopy w/ polypectomy N/A 05/19/2017    Procedure: COLONOSCOPY W/ POLYPECTOMY;  Surgeon: Loma Sousa, MD;  Location: [facility];  Service: Gastroenterology;  Laterality: N/A;    Endoscopy  12/2014    Hiatal hernia repair      October 14, 2015 @ [facility].    Mammogram  2015    Other surgical history      CARDIAC ULTRASOUND OCTOBER 2017 - FOUND ATRIAL ENLARGEMENT AND LEAKING MITRAL VALVE. PER PT.02/18/16    Surgery on ear      age 51 mths    Tonsillectomy      age 61    Upper gastrointestinal endoscopy  04/2010    Dr. Opal Sidles    Upper gastrointestinal endoscopy  10/21/2020     Referral Information:  Referral Source: Nursing assessment    Discussed role of CM and completed assessment with:: Patient  Patient: At bedside  Are you a veteran?: No  Do you  have a PCP?: Yes    Interpreter Needed?: Not applicable  Demographics and Insurance verified with:: Patient    Insurance Information         MEDICARE -[place] GBA/MEDICARE-A/B Phone: --    Subscriber: Tracie Turner Subscriber#: [id number]    Group#: -- Precert#: --      [place] Phone: --    Subscriber: Tracie Turner, Tracie Turner Subscriber#: [id number]    Group#: -- Precert#: --             Patient admitted from: Patient's Home-Caregiver Support      Functional  status prior to admission: Independent - not needing assistance  Receives Help From/Support System: Sibling                                Home Living:   Type of Home: House  Home Layout: One level (3 Steps)  Bathroom Shower/Tub: Medical sales representative: Midwife: Retail buyer: Accessible  Home Equipment: Standard walker  Mode of Transportation: Support person, Scientist, forensic Used at Home:      Social Determinants of Health:  In the past 12 months, has lack of transportation kept you from medical appointments or from getting medications?: No  In the past 12 months, has lack of transportation kept you from meetings, work, or from getting things needed for daily living?: No  Income source: SSI/Pension/Retirement          Assessment and Plan:  Current Hospital Functional Status: Independent- not needing assistance  Anticipated Disposition: Patient's Home-Caregiver Support  Anticipated needs: Transportation    Discharge Plan:  Facility/Agency Type: Home, no needs  Case Manager Name and Phone Number: Carlean Purl [phone]  Nursing Unit: [facility] ED [id number]  Family Composition: Pt's Sister        Discharge Transportation: Other (comment) ([facility])    CM received consult re:   Medically Complex N/A   Psychosocially Complex N/A     CM introduced self and explained role, Demographic and insurance information verified. Pt reports she resides at home with her sister and is  independent with her ADLs. She denies any transportation, food, or medication barriers. CM discussed discharge planning with the pt. She reports she is not discharging home because she is not happy with the care she has received. She reports to this CM "no one has checked her heart and she could go home and have a heart attack". Pt then started to state to this CM when she stands she gets very dizzy and reports that this has not been addressed. CM attempted to answer all questions as appropriate and re-direct pt to discharge plan. Pt told this CM that she wanted CM to arrange her a taxi to [facility]. She also reports CM or someone will need to cover the cost because she has no money on her. CM explained that she could arrange [facility] as pt is independent with mobility to her home. Pt replied "Then how will I get to [facility]". CM stated she would be unsure after discharge how pt would be able to obtain transportation. Pt replied then I am not discharging and requested to speak with the attending. CM sent a secure EPIC chat informing Laveda Norman, PA-C and pt's attending nurse Summer Williams. No additional CM needs identified as pt declined CM services such as HH. Level of care algorithm used for discharge planning. Case Management has assessed this patient/family or caregiver's readiness, willingness and ability to provide or support self-management activities when needed after discharge from the acute care setting.      CM Next Review Date: 02/20/22        Electronically signed:  Carlean Purl, MSW  02/13/2022 10:26 AM  Electronically signed by Carlean Purl, MSW at 02/13/2022 10:31 AM EST

## (undated) NOTE — Progress Notes (Signed)
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 75, TG 85 as above.  See obesity below.  Further management of HLP as per PCP.    OSA -Per patient her CPAP machine is in storage and she still doesn't use it.  I strongly encouraged patient again to discuss further management of sleep apnea with her sleep physician and get CPAP from the storage and start using it without any delay.  Patient agreed.       Class II obesity-Patient has lost 3 pounds over the last 2 months and weight is 202 pounds with BMI 35.9.  Patient prior gained 3 pounds over 1.5 months.   Prior patient lost 32 pounds over the 6 years, since 2017.  I strongly encouraged patient to stay on low salt, low  cholesterol diet, walk daily and continue losing weight.    Family history of early CAD - noted.  Patient takes aspirin and Crestor.  Recent lipid panel was noted at goal as above.  Nuclear stress test was negative for ischemia or scar with normal LV systolic function from November 2022 as above.    PLAN:    Please discuss with your sleep physician further management of sleep apnea and restart using CPAP.    Please stand up slowly to avoid dizziness and falls.    Please use cane or walker to avoid falls.    Please discuss with your PCP using compression stockings.    Please discuss with your PCP chronic diarrhea.    Keep checking blood pressure and heart rate daily and record it and let us know if any abnormal readings noted.    Stay on low salt, low cholesterol diet, walk daily and continue losing weight.    Drink plenty of water - about 80 ounces or more a day - to avoid dehydration and volume depletion.    Please avoid  caffeine.    Avoid any strenuous activities.    Please avoid alcohol.    If you develop significant chest pain, shortness of breath, palpitations, near-syncope or syncope or lightheadedness, please call 911 and be transferred to emergency room for evaluation.    We will follow the patient in 6 months or earlier if needed.    Care plan and follow-up as discussed or as needed if any worsening symptoms or change in condition. Pt expressed understanding. No barriers to meeting goals. After visit summary was given to the patient.    Wille Glaser, MD, PhD    Documentation for time-based billing:  Total time spent of date of service was 58 minutes.  Patient care activities included 58 minutes.  Patient care activities included preparing to see the patient such as reviewing the patient records, performing a medically appropriate history and physical examination, counseling and educating the patient and documenting clinical information in the electronic record.    Note: This documented was generated using voice recognition software. There may be unintended transcription errors that were not detected upon document review.      Electronically signed by Wille Glaser, MD at 02/24/2022  1:30 PM EST

## (undated) NOTE — ED Provider Notes (Signed)
Formatting of this note is different from the original.  [facility]    ED INITIAL NOTE FOR EXTENDED STAY    Date & Time of assessment:  12/10/20237:37 AM    Observation Time:  Placed in observation at 7:38 AM.    History and Physical:   I have reviewed previous ED H&P and there are no changes.    Family History       Problem Relation Age of Onset Comments    COPD Cousin      Cancer Daughter  cervical    Cancer Maternal Aunt  breast    Heart disease Father      Heart disease Mother      Hypertension Mother      Stroke Mother           Additional Family history: None.    Placed in Obs for:  Dizziness and abnormal CTA Neck. Awaiting consult by [facility] and [facility]. Dispo per these services for intermittent dizziness    Plan:  Consults by [facility] and [facility]. Dispo pending    Electronically signed by:    Casilda Carls, PA-C  02/13/22 [phone]    Electronically signed by Casilda Carls, PA-C at 02/13/2022  7:39 AM EST
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
## (undated) NOTE — ED Notes (Signed)
Formatting of this note might be different from the original.  Patient up to bedside commode independently with this RN in the room. Patient states no dizziness upon standing/walking. Patient requests for RN to leave the room while using the bedside commode. Patient instructed to use call bell when finished. Call bell in reach.  Electronically signed by Lillia Mountain, RN at 02/13/2022 10:01 AM EST

## (undated) NOTE — Care Plan (Signed)
Formatting of this note might be different from the original.    Problem: Discharge Planning, Adult/Pediatric  Intervention: Discharge needs assessment (edit field to enter specific needs identified)  Note: POC Home  Intervention: Facilitate communication re: discharge plan with patient/caregiver and pertinent members of the healthcare team  Note: POC discussed with Pt and IDT    Electronically signed by Carlean Purl, MSW at 02/13/2022 10:25 AM EST

---

## 1983-06-06 HISTORY — PX: CHOLECYSTECTOMY: SHX55

## 1999-11-02 ENCOUNTER — Emergency Department (HOSPITAL_COMMUNITY): Admission: EM | Admit: 1999-11-02 | Discharge: 1999-11-02 | Payer: Self-pay | Admitting: Emergency Medicine

## 1999-12-30 ENCOUNTER — Emergency Department (HOSPITAL_COMMUNITY): Admission: EM | Admit: 1999-12-30 | Discharge: 1999-12-30 | Payer: Self-pay | Admitting: *Deleted

## 2000-01-13 ENCOUNTER — Other Ambulatory Visit: Admission: RE | Admit: 2000-01-13 | Discharge: 2000-01-13 | Payer: Self-pay | Admitting: Obstetrics and Gynecology

## 2000-04-17 ENCOUNTER — Emergency Department (HOSPITAL_COMMUNITY): Admission: EM | Admit: 2000-04-17 | Discharge: 2000-04-17 | Payer: Self-pay | Admitting: Emergency Medicine

## 2000-04-17 ENCOUNTER — Encounter: Payer: Self-pay | Admitting: Emergency Medicine

## 2000-10-06 ENCOUNTER — Emergency Department (HOSPITAL_COMMUNITY): Admission: EM | Admit: 2000-10-06 | Discharge: 2000-10-07 | Payer: Self-pay | Admitting: Emergency Medicine

## 2001-01-04 ENCOUNTER — Other Ambulatory Visit: Admission: RE | Admit: 2001-01-04 | Discharge: 2001-01-04 | Payer: Self-pay | Admitting: Obstetrics and Gynecology

## 2001-03-31 ENCOUNTER — Emergency Department (HOSPITAL_COMMUNITY): Admission: EM | Admit: 2001-03-31 | Discharge: 2001-04-01 | Payer: Self-pay | Admitting: Emergency Medicine

## 2003-01-30 ENCOUNTER — Emergency Department (HOSPITAL_COMMUNITY): Admission: EM | Admit: 2003-01-30 | Discharge: 2003-01-31 | Payer: Self-pay | Admitting: Emergency Medicine

## 2003-06-03 ENCOUNTER — Inpatient Hospital Stay (HOSPITAL_COMMUNITY): Admission: AD | Admit: 2003-06-03 | Discharge: 2003-06-03 | Payer: Self-pay | Admitting: *Deleted

## 2003-07-15 ENCOUNTER — Other Ambulatory Visit: Admission: RE | Admit: 2003-07-15 | Discharge: 2003-07-15 | Payer: Self-pay | Admitting: Obstetrics and Gynecology

## 2005-03-07 DIAGNOSIS — I059 Rheumatic mitral valve disease, unspecified: Secondary | ICD-10-CM

## 2005-03-07 DIAGNOSIS — G709 Myoneural disorder, unspecified: Secondary | ICD-10-CM

## 2005-03-07 HISTORY — DX: Myoneural disorder, unspecified: G70.9

## 2005-03-07 HISTORY — DX: Rheumatic mitral valve disease, unspecified: I05.9

## 2007-05-28 ENCOUNTER — Ambulatory Visit: Payer: Self-pay | Admitting: Gastroenterology

## 2007-05-28 DIAGNOSIS — R1310 Dysphagia, unspecified: Secondary | ICD-10-CM | POA: Insufficient documentation

## 2007-07-02 ENCOUNTER — Ambulatory Visit: Payer: Self-pay | Admitting: Gastroenterology

## 2007-07-02 ENCOUNTER — Encounter: Payer: Self-pay | Admitting: Gastroenterology

## 2007-08-08 ENCOUNTER — Emergency Department (HOSPITAL_COMMUNITY): Admission: EM | Admit: 2007-08-08 | Discharge: 2007-08-08 | Payer: Self-pay | Admitting: Emergency Medicine

## 2007-08-09 ENCOUNTER — Encounter: Admission: RE | Admit: 2007-08-09 | Discharge: 2007-08-09 | Payer: Self-pay | Admitting: Otolaryngology

## 2007-10-05 ENCOUNTER — Encounter: Admission: RE | Admit: 2007-10-05 | Discharge: 2007-10-05 | Payer: Self-pay | Admitting: Neurology

## 2007-10-13 ENCOUNTER — Encounter: Admission: RE | Admit: 2007-10-13 | Discharge: 2007-10-13 | Payer: Self-pay | Admitting: Neurology

## 2007-11-13 ENCOUNTER — Encounter: Admission: RE | Admit: 2007-11-13 | Discharge: 2007-12-12 | Payer: Self-pay | Admitting: Neurology

## 2008-01-14 ENCOUNTER — Observation Stay (HOSPITAL_COMMUNITY): Admission: EM | Admit: 2008-01-14 | Discharge: 2008-01-18 | Payer: Self-pay | Admitting: Emergency Medicine

## 2008-01-14 DIAGNOSIS — G459 Transient cerebral ischemic attack, unspecified: Secondary | ICD-10-CM

## 2008-01-14 DIAGNOSIS — I639 Cerebral infarction, unspecified: Secondary | ICD-10-CM

## 2008-01-14 HISTORY — DX: Cerebral infarction, unspecified: I63.9

## 2008-01-14 HISTORY — DX: Transient cerebral ischemic attack, unspecified: G45.9

## 2008-01-15 ENCOUNTER — Ambulatory Visit: Payer: Self-pay | Admitting: Vascular Surgery

## 2008-01-15 ENCOUNTER — Encounter (INDEPENDENT_AMBULATORY_CARE_PROVIDER_SITE_OTHER): Payer: Self-pay | Admitting: Internal Medicine

## 2008-01-16 ENCOUNTER — Encounter (INDEPENDENT_AMBULATORY_CARE_PROVIDER_SITE_OTHER): Payer: Self-pay | Admitting: Internal Medicine

## 2008-04-11 LAB — HM COLONOSCOPY

## 2008-04-14 ENCOUNTER — Encounter: Admission: RE | Admit: 2008-04-14 | Discharge: 2008-04-14 | Payer: Self-pay | Admitting: Neurology

## 2009-01-27 ENCOUNTER — Emergency Department
Admit: 2009-01-27 | Disposition: A | Discharge status: Home / Self Care | Payer: Self-pay | Source: Ambulatory Visit | Attending: Emergency Medicine | Admitting: Emergency Medicine

## 2009-04-11 LAB — HM PAP SMEAR: HM Pap smear: NORMAL

## 2009-04-11 LAB — HM MAMMOGRAPHY

## 2010-03-28 ENCOUNTER — Encounter: Payer: Self-pay | Admitting: Neurology

## 2010-03-29 ENCOUNTER — Encounter: Payer: Self-pay | Admitting: Neurology

## 2010-04-06 NOTE — Procedures (Signed)
Summary: EGD with Dilitation   EGD  Procedure date:  07/02/2007  Findings:      Findings: Stricture:  Location: [facility]    Patient Name: Joy Jimenez, Joy Jimenez. MRN:  Procedure Procedures: Panendoscopy (EGD) CPT: 43235.    with biopsy(s)/brushing(s). CPT: D1846139.    with esophageal dilation. CPT: G9296129.  Personnel: Endoscopist: Barbette Hair. Arlyce Dice, MD.  Indications Symptoms: Dysphagia.  History  Current Medications: Patient is not currently taking Coumadin.  Pre-Exam Physical: Performed Jul 02, 2007  Cardio-pulmonary exam, Abdominal exam WNL.  Exam Exam Info: Maximum depth of insertion Duodenum, intended Duodenum. Patient position: on left side. Vocal cords visualized. Gastric retroflexion performed. ASA Classification: I. Tolerance: good.  Sedation Meds: Patient assessed and found to be appropriate for moderate (conscious) sedation. Fentanyl 75 mcg. given IV. Versed 7 mg. given IV. Robinul 0.2 given IV. Cetacaine Spray 2 sprays given aerosolized.  Monitoring: BP and pulse monitoring done. Oximetry used. at 2 Liters.  Findings HIATAL HERNIA: Regular, 5 cms. in length.  STRICTURE / STENOSIS: Stricture in Distal Esophagus.  Constriction: partial. 40 cm from mouth. ICD9: Esophageal Stricture: 530.3.  - Dilation: Distal Esophagus. Maloney dilator used, Diameter: 18 mm, Moderate Resistance, No Heme present on extraction. 1  total dilators used. Outcome: successful.  POLYP: in Body. Maximum size: 2 mm. sessile polyp. Procedure: biopsy without cautery, ICD9: Neoplasia, Benign, GI Tract, NEC/NOS: 211.9. Comments: Multiple polyps ranging from 1-34mm in body.  POLYP: in Body. sessile polyp. Procedure: biopsy without cautery, ICD9: Neoplasia, Benign, GI Tract, NEC/NOS: 211.9.  POLYP: in Body. sessile polyp. Procedure: biopsy without cautery, ICD9: Neoplasia, Benign, GI Tract, NEC/NOS: 211.9.  POLYP: in Body. sessile polyp. Procedure: biopsy without cautery, ICD9:  Neoplasia, Benign, GI Tract, NEC/NOS: 211.9.  POLYP: in Body. sessile polyp. Procedure: biopsy without cautery, ICD9: Neoplasia, Benign, GI Tract, NEC/NOS: 211.9.  - Normal: Body to Duodenal 2nd Portion.   Assessment Abnormal examination, see findings above.  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  530.3: Esophageal Stricture.   Events  Unplanned Intervention: No unplanned interventions were required.  Unplanned Events: There were no complications. Plans Medication(s): Await pathology. Continue current medications.  Scheduling: Follow-up prn.

## 2010-06-01 ENCOUNTER — Ambulatory Visit
Admit: 2010-06-01 | Discharge: 2010-06-01 | Disposition: A | Discharge status: Home / Self Care | Payer: Self-pay | Source: Ambulatory Visit | Admitting: Family Medicine

## 2010-06-01 ENCOUNTER — Encounter: Payer: Self-pay | Admitting: Gastroenterology

## 2010-06-01 ENCOUNTER — Encounter: Payer: Self-pay | Admitting: Primary Care

## 2010-06-01 ENCOUNTER — Ambulatory Visit: Payer: Self-pay | Admitting: Primary Care

## 2010-06-01 NOTE — Miscellaneous (Unsigned)
 Continuity of Care Record  Created: todo  From: DEVINE, MATHEW  From:   From: [employer], EHR v10.2.7.53  To: Makki, Providencia CAMILLE  Purpose: Patient Use;       Problems  Problem: No History of Smoking    Social History  No History of Smoking    Alerts  Allergy - TEGretol TABS     Medications  Aspirin 81 MG Tablet; TAKE 1 TABLET DAILY. ; Rx   Lexapro 20 MG Tablet; TAKE 1 TABLET DAILY. ; Rx   Metamucil 30.9 % Powder; Mix 1 tablespoon into 8oz of fluid two or three   times a day, as tolerated. ; Rx   Omeprazole 40 MG Capsule Delayed Release; TAKE 1 CAPSULE TWICE DAILY ; Rx   Prevacid 30 MG Capsule Delayed Release; TAKE 1 CAPSULE BY MOUTH EVERY DAY ;   Rx   Vitamin C 500 MG Tablet Chewable; TAKE 1 TABLET DAILY ; Rx

## 2010-06-02 NOTE — Progress Notes (Addendum)
 Reason For Visit   NPO-Pt is here to establish care today.  Allergies   TEGretol TABS.  Active Problems   No History of Smoking.  Vital Signs   Recorded by nhensey on 01 Jun 2010 01:56 PM  BP:122/80,  LUE,  Sitting,   HR: 68 b/min,  L Radial, Normal,   Weight: 218.6 lb.  Used large cuff.  SOAP   Cc: new patient visit, note patient was about 25 minutes late: abbreviated   visit due to time.   HPI: would like to establish care and seek help for hiatal hernia.   -Was being seen at a different pcp office but decided to switch practices   because the provider did not proceed with surgical therapy for a hiatal   hernia.   -Patient complains of a hiatal hearnia discovered on imaging.   -Able to eat solid and liquid foods  -Takes ppi for heart burn     No other medical problems.      Psych history: Depression for which she takes lexapro. Has a therapist.   Denies any psychiatric hospitlaizations, history of psychotic illness,   bipolar disease.      PMHx: as above and see scanned document  PSHx: see scanned document  FAm Hx: See scanned genogram  Soc: Lives alone with one cat. 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 HaS one daughter, was married for 7   years and then divorced.   Now lives alone with 2 cats.      O: VSS afebrile normotensive  GEN: Dishieveled, somewhat unkempt. Disorganized thought process. Sometimes   tangential. Religious and snake themes in history of college education,   also insists on a catholic physician but does not mind when I tell her I am   not Catholic. Denies suicidality or homocidality. Mood is good, and affect   is appropriate.      A/P: 65 year old woman with unclear medical history and likely psychiatric   pathology here for establishment of care.   1. Record release from various health care  providers in the past  2. Encourage patient to bring freind or family member to next visit  3. No medications today as very uncertain record  4. Referral to Dr. Alveda Reasons for psychiatric evaluation  5. Follow up asap for full new patient visit and physical exam.      .  Attestation   At the time of the visit, I discussed the patient's history, exam findings,   diagnosis and treatment plan with the above named resident.  I participated   in the direction of the service as documented.    S: NPO, here to est care.    Complains of hernia - N/V.  Wants Pepcid  Depression; on Lexapro  ?On tegretol, not sure why.    A/P: New patient, with VERY unclear history. ?Psych history.  Get records from multiple past doctors and states  Hernia; continue pepcid  Depression; no SI, restart meds, refer to [facility]  No SI or HI  F/u soon, hopefully with some collateral information either from records or   family member attending with her.     Elizebeth Brooking, MD, 06/01/2010.  Signature   Electronically signed by: Oswaldo Conroy  DO-Res; 06/02/2010 6:55 PM EST.  Electronically signed by: Elizebeth Brooking  M.D.; 06/03/2010 1:09 PM EST;   Preceptor.

## 2010-06-09 ENCOUNTER — Ambulatory Visit: Payer: Self-pay | Admitting: Primary Care

## 2010-06-11 ENCOUNTER — Ambulatory Visit: Payer: Self-pay | Admitting: Primary Care

## 2010-06-16 ENCOUNTER — Encounter: Payer: Self-pay | Admitting: Family Medicine

## 2010-06-16 ENCOUNTER — Ambulatory Visit
Admit: 2010-06-16 | Discharge: 2010-06-16 | Disposition: A | Discharge status: Home / Self Care | Payer: Self-pay | Source: Ambulatory Visit | Admitting: Radiology

## 2010-06-16 ENCOUNTER — Ambulatory Visit: Payer: Self-pay | Admitting: Family Medicine

## 2010-06-16 NOTE — Progress Notes (Addendum)
 Reason For Visit   Tracie Turner is a 73 year year old who presents to the office with   complaints of: worsening hiatal hernia pain.  Allergies   TEGretol TABS.  Active Problems   No History of Smoking.  Vital Signs   Recorded by lessig on 16 Jun 2010 09:28 AM  BP:122/88,  LUE,  Sitting,   HR: 82 b/min,  L Radial, Normal,   Weight: 211 lb,   Pain Scale: 5.  SOAP   S:  1. Hiatal hernia - having significant heartburn sx's despite being on   prevacid and avoiding pasta sauces  2. Depression - feeling more depressed lately as off lexapro for 1 wk, as   did not want to take the generic escitalopram that was given to her, as it   does not work for her     ROS:  1. Recent diarrheal illness     O:  Vitals above  Gen NAD  Abd Soft, ND.     A/P:  1.  Hiatal hernia - Trial hi-dose omeprazole or continue/increase   prevacid.  GI referral for EGD.  Avoid dietary triggers.  Increase fiber   intake to keep BMs regular.   2. Depression - Resume lexapro.     Followup: 81mo with CCP for CPE.  Orders   Omeprazole 40 MG Capsule Delayed Release;TAKE 1 CAPSULE TWICE DAILY;   Qty180; R3; Rx.  Aspirin 81 MG Tablet;TAKE 1 TABLET DAILY; Qty90; R3; Rx.  Attestation   At the time of the visit, I discussed the patient's history, exam findings,   diagnosis and treatment plan with the above named resident.  I participated   in the direction of the service as documented.    S: epigastric pain  A/P: [trial omeprazole 40-  ]  NICHOLAS KILMER,, 06/16/2010.  Signature   Electronically signed by: Pryor Curia  MD - Res.; 06/16/2010 1:27 PM   EST.  Electronically signed by: Madaline Guthrie  MD Attend.; 06/16/2010 3:43 PM   EST.

## 2010-07-02 ENCOUNTER — Ambulatory Visit: Payer: Self-pay | Admitting: Primary Care

## 2010-07-02 ENCOUNTER — Encounter: Payer: Self-pay | Admitting: Primary Care

## 2010-07-02 ENCOUNTER — Ambulatory Visit
Admit: 2010-07-02 | Discharge: 2010-07-02 | Disposition: A | Discharge status: Home / Self Care | Payer: Self-pay | Source: Ambulatory Visit | Admitting: Family Medicine

## 2010-07-02 LAB — COMPREHENSIVE METABOLIC PANEL
ALT: 21 U/L (ref 0–35)
AST: 25 U/L (ref 0–35)
Albumin: 4.1 g/dL (ref 3.5–5.2)
Alk Phos: 83 U/L (ref 35–105)
Anion Gap: 10 (ref 7–16)
Bilirubin,Total: 0.6 mg/dL (ref 0.0–1.2)
CO2: 27 mmol/L (ref 20–28)
Calcium: 9.7 mg/dL (ref 8.6–10.2)
Chloride: 107 mmol/L (ref 96–108)
Creatinine: 0.99 mg/dL — ABNORMAL HIGH (ref 0.51–0.95)
GFR,Black: >59 *
GFR,Caucasian: 56 * — AB
Glucose: 97 mg/dL (ref 74–106)
Lab: 20 mg/dL (ref 6–20)
Potassium: 4.6 mmol/L (ref 3.3–5.1)
Sodium: 144 mmol/L (ref 133–145)
Total Protein: 7.3 g/dL (ref 6.3–7.7)

## 2010-07-02 LAB — CBC
Hematocrit: 39 % (ref 34–45)
Hemoglobin: 12.5 g/dL (ref 11.2–15.7)
MCV: 95 fL (ref 79–95)
Platelets: 368 10*3/uL (ref 160–370)
RBC: 4.2 MIL/uL (ref 3.9–5.2)
RDW: 14.2 % (ref 11.7–14.4)
WBC: 5.8 10*3/uL (ref 4.0–10.0)

## 2010-07-02 LAB — LIPID PANEL
Chol/HDL Ratio: 3.2
Cholesterol: 179 mg/dL
HDL: 56 mg/dL
LDL Calculated: 108 mg/dL
Non HDL Cholesterol: 123 mg/dL
Triglycerides: 77 mg/dL

## 2010-07-02 LAB — TSH: TSH: 2.9 u[IU]/mL (ref 0.27–4.20)

## 2010-07-03 LAB — HEMOGLOBIN A1C: Hemoglobin A1C: 5.6 % (ref 4.0–6.0)
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
## 2010-07-04 NOTE — Progress Notes (Addendum)
 Reason For Visit   Here for annual physical exam, also wants to discuss GERD and depression.   .  Allergies   TEGretol TABS.  Current Meds   Metamucil 30.9 % Powder;Mix 1 tablespoon into 8oz of fluid two or three   times a day, as tolerated.; Rx  Vitamin C 500 MG Tablet Chewable;TAKE 1 TABLET DAILY; Rx  Aspirin 81 MG Tablet;TAKE 1 TABLET DAILY.; Rx  Lexapro 20 MG Tablet;TAKE 1 TABLET DAILY.; Rx  Omeprazole 40 MG Capsule Delayed Release;TAKE 1 CAPSULE TWICE DAILY; Rx.  Active Problems   No History of Smoking.  Vital Signs   Recorded by HENSEY,NICOLE on 02 Jul 2010 09:12 AM  BP:124/72,  LUE,  Sitting,   HR: 76 b/min,  L Radial, Normal,   Height: 63 in, Weight: 212 lb, BMI: 37.6 kg/m2,   Pain Scale: 0.  SOAP   S:   Here for pe but other complaints     1. GERD/Hiatal hernia  -Planning on [place] GI doc for f/u.   -Thinks she needs another colonoscopy, but does not want another GI doc   here to perfrom because she had a good relationship with her last GI doc,   who spent time explaining procedure to her, and had the practice of taking   out all polyps.   -GERD is well controlled for now with omeprazole.      2. Blood in stool  -Several occurances of bright red blood in stool  -Just on toilet paper  -Has h/o hemorrhoids     3. Depression  -HAd lexapro filled at last visit   -Pharmacist wanted to give her generic, which she is vehemently opposed to  -Rx was printed out with DAW written on it.      ROS: No vomiting, no nausea. Constipated, has bm every day but are very   hard with much straining. No fevers.      O:  VS-obese, normotensive  GEN- NAD slightly oddly dressed.   PSYCH: affect is fluid, mood depressed. appearance somewhat strange but   well groomed, eye contact is good. language fluent. Firmly held beliefs   borderlining delusions about providers (persecutory themes, Dr. Trula Slade   wrote rx so it was generic) or religious (must go to catholic   providers/hospitals) also strong  persecutory beliefs about neighbors.   Insight very poor, judgement is also poor.      A/P: 65 year old woman with GERD 2/2 hiatal hearnia, hematochezia and   depression with some delsuional or otherwise psychotic/bizzare features.      1. GI  -Recommend seeing a local MD, plan to save money and fly back to the [facility]   for this is strongly discouraged.   -Will refer to Dr. Serita Sheller, great bedside manner.   -CBC to check for blood loss anemia  -Will likely need scope/scan to evaluate for hernia as well as blood in   stool  -Consitpated: recommend ncreasing fiber in diet, water in diet, can add   miralax     2. Psych  -HAve placed referral to [facility] one month ago, will f/u on progress of   referral  -Need a better diagnosis, other delusional features are worrisome  -Continue lexapro, went over DAW on rx     3. F/U one month or sooner prn.   Flonnie Hailstone   At the time of the visit, I discussed the patient's history, exam findings,   diagnosis and treatment plan  with the above named resident.  I participated   in the direction of the service as documented.    S: Depression; GERD; BRBPR; h/o colonic polyps  O: Reviewed and agree as detailed above  A/P: Depression:  refilled Lexapro;  supportive counseling today; referral   to Dr. Heloise Beecham;  GERD: stable; continue PPI; refer to Dr. Anselm Jungling   for colonoscopy;   fu 3 weeks sooner prn  SUZANNE LEE ([facility]),, 07/06/2010.  Signature   Electronically signed by: Oswaldo Conroy  DO-Res; 07/04/2010 1:00 PM EST.  Electronically signed by: Homero Fellers)  M.D.; 07/06/2010 8:54 AM EST.

## 2010-07-19 ENCOUNTER — Other Ambulatory Visit: Payer: Self-pay

## 2010-07-20 NOTE — Discharge Summary (Signed)
Joy Jimenez, Joy Jimenez NO.:  [id number]   MEDICAL RECORD NO.:  [id number]          PATIENT TYPE:  OBV   LOCATION:  4705                         FACILITY:  [facility]   PHYSICIAN:  Isidor Holts, M.D.  DATE OF BIRTH:  March 07, 1946   DATE OF ADMISSION:  01/14/2008  DATE OF DISCHARGE:  01/18/2008                               DISCHARGE SUMMARY   DISCHARGE DIAGNOSES:  1. Status post fall/back strain.  2. Presyncope.  3. Orthostasis.  4. Detrusor instability.  5. History of lumbar spondylosis.  6. Gastroesophageal reflux disease/esophageal stricture.  7. History of depression.  8. Recent acute bronchitis.  9. Chronic sinusitis.   DISCHARGE MEDICATIONS:  1. Lexapro 20 mg p.o. daily.  2. Prevacid 30 mg p.o. daily.  3. Detrol LA 2 mg p.o. daily.  4. Darvocet-N 100 one p.o. t.i.d. for 7 days only.   PROCEDURES:  1. X-ray lumbar spine dated January 14, 2008.  This showed no acute      findings.  There was stable mild lumbar spondylosis.  2. Head CT scan dated January 14, 2008.  This showed no acute      intracranial findings.  There was mild chronic microvascular white      matter disease, chronic sinusitis.  3. Bilateral carotid/vertebral artery duplex scan dated January 15, 2008.  This showed mild mixed plaque noted throughout bilaterally.      Vertebral artery flow antegrade bilaterally.  No significant right      ICA stenosis noted.  No significant left ICA stenosis noted.   CONSULTATIONS:  None.   ADMISSION HISTORY:  As in H and P notes of January 14, 2008, dictated by  Dr. Midge Minium.  However, in brief, this is a 65 year old female,  with known history of lumbar spondylosis, GERD, esophageal stricture  status post previous dilatation, depression, recently treated acute  bronchitis, presenting following a fall, with low back pain.  On initial  evaluation, she was found to be hemodynamically stable although blood  pressure was mildly elevated at  150/87 mmHg.  She did have a slight  bruise on the forehead which she had sustained during the fall, hitting  her head on the door.  Physical examination showed no focal neurologic  deficits.  She was admitted for further evaluation, investigation and  management.   CLINICAL COURSE:  1. Presyncope.  For details of presentation, refer to admission      history above.  The patient did not actually pass out.  She appears      to have had a presyncopal episode.  Cardiac enzymes were cycled,      remained unelevated.  12-lead EKG showed no acute ischemic changes.      Telemetric monitoring revealed no arrhythmias.  Subsequently      orthostatic measurements were done, and revealed significant      orthostasis with BP of 106/65 lying and 80/53 sitting and 70/58      standing.  The patient was managed initially with intravenous      infusion of 1/2 normal saline and as of  January 15, 2008,      orthostasis had improved, but the patient still complained of mild      dizziness.  By January 17, 2008, orthostasis had completely      resolved and even after discontinuation of intravenous fluids      repeat orthostatic measurements showed a lying blood pressure of      132/76, sitting blood pressure of 139/97, standing blood pressure      of 139/90 mmHg, and the patient was completely asymptomatic.  Pulse      rate ranged between 61 and 66.  She was found to have a serum      cortisol level which was low at 2.8, raising the suspicion of      possible adrenal insufficiency.  However, short Cortrosyn test      carried out on January 17, 2008, showed a baseline cortisol of 7.0      and a 30 minute cortisol of 32.4, effectively ruling out      hypocortisolism.  The patient has been reassured accordingly.  Head      CT scan showed no evidence of intracranial acute abnormality.      Bilateral carotid and vertebral artery duplex scans were      unremarkable for concerning pathology.  2-D echocardiogram  was done      on January 16, 2008.  However, as of date of this dictation,      report was still pending.  The patient's lipid profile showed the      following findings total cholesterol 179, triglyceride 81, HDL 56,      LDL 107, i.e. excellent lipid profile.  Incidentally her TSH was      normal at 4.192.   1. Chronic sinusitis.  This was an incidental finding on head CT scan      as described above.   1. Low back pain.  This was secondary to back strain following fall,      against a background of preexistent lumbar spondylosis, and was      managed with analgesics, with amelioration of symptoms.   1. History of gastroesophageal reflux disease/esophageal stricture.      The patient showed no evidence of dysphagia during  the course of      this hospitalization, and there were no problems referable to      gastroesophageal reflux disease.  She continues on proton pump      inhibitor.   1. Depression.  The patient's mood remained stable during the course      of this hospitalization.  She continues her pre-admission Lexapro      treatment.   1. Recent acute bronchitis.  The patient was fully treated for this,      pre-admission, and had no respiratory tract symptoms during the      course of this hospitalization.   1. Detrusor instability.  The patient did complain of urinary      frequency without dysuria.  Urinalysis was negative.  On detailed      questioning, it appears she used to be on Detrol LA.  This has been      recommenced at a low dose of 2 mg p.o. daily.   DISPOSITION:  The patient was on January 18, 2008, asymptomatic,  considered clinically stable for discharge, and was therefore discharged  accordingly.   DIET:  Heart-healthy.   ACTIVITY:  As tolerated.   FOLLOWUP INSTRUCTIONS:  The patient is recommended to follow up  routinely with her primary MD.  In addition, she is to follow up with  her primary neurologist, Dr. Avie Echevaria, per prior scheduled  appointment  and with her primary cardiologist, Dr. Nicki Guadalajara, per prior scheduled  appointment.  Her 2-D echocardiogram may be followed up by her  cardiologist during her office visit.      Isidor Holts, M.D.  Electronically Signed     CO/MEDQ  D:  01/18/2008  T:  01/18/2008  Job:  [id number]   cc:   Nicki Guadalajara, M.D.  Genene Churn. Love, M.D.

## 2010-07-20 NOTE — H&P (Signed)
NAMEGERALDINE, Jimenez NO.:  [id number]   MEDICAL RECORD NO.:  [id number]          PATIENT TYPE:  INP   LOCATION:  4729                         FACILITY:  [facility]   PHYSICIAN:  Eduard Clos, MDDATE OF BIRTH:  1945/09/10   DATE OF ADMISSION:  01/14/2008  DATE OF DISCHARGE:                              HISTORY & PHYSICAL   PRIMARY NEUROLOGIST:  Genene Churn. Love, M.D.   CHIEF COMPLAINT:  Fall.   HISTORY OF THE PRESENTING ILLNESS:  The patient is a 65 year old female  with a history of lumbar spondylosis, GERD, esophageal stricture, and  depression who presents with complaints of low-back pain after the  patient had a fall. The patient states she was picking up parcels from  the [employer] and when she tried to go to the door she suddenly fell and hit  her head on the door.  She is not sure if she lost consciousness.  She  denies any chest pain, prior to or after the episode.  She denies any  shortness of breath, weakness in limbs, incontinence of urine or tongue-  biting.  She denies any fever or chills.  She was recently treated for  bronchitis with amoxicillin and is presently asymptomatic.  No cough,  production of sputum or shortness of breath as noted above.  The patient  is being admitted for further management or her low-back pain and near  syncope.   PAST MEDICAL HISTORY:  1. Lumbar spondylosis.  2. Bronchitis; recently treated.  3. GERD.  4. Esophageal stricture.  5. Depression.   PAST SURGICAL HISTORY:  1. Cholecystectomy.  2. The patient has had an EGD with stricture dilatation, which the      patient's states was not complete.  3. Tonsillectomy.   MEDICATIONS:  The patient's medications prior to admission included:  1. Lexapro 20 mg by mouth daily.  2. Prevacid 30 mg by mouth daily.  3. Mucinex 600 mg by twice daily.  4. Amoxicillin 875 by mouth twice a day.   ALLERGIES:  TEGRETOL.   SOCIAL HISTORY:  The patient lives alone.  Denies smoking  cigarettes,  drinking alcohol or using illegal drugs.   REVIEW OF SYSTEMS:  On the review of systems other than that noted in  the history of present illness there is nothing else of significance.   PHYSICAL EXAMINATION:  GENERAL APPEARANCE:  The patient is examined at  the bedside and is not in acute distress.  VITAL SIGNS:  Blood pressure 150/87, pulse 85/minute, temperature 97.8,  respirations 18/minute, and O2 saturation 98%.  HEENT:  Anicteric.  No pallor.  No facial asymmetry.  Tongue is midline.  PERRLA positive.  There is a slight bruise on the forehead that the  patient sustained after the fall hitting on the door.  CHEST:  The chest show bilateral air entry present.  No rhonchi or  crepitations.  HEART:  S1 and S2 heard.  ABDOMEN: The abdomen is soft and nontender with bowel sounds heard.  No  guarding, no rigidity.  CNS:  Alert, awake and oriented to time, place and person.  Moves upper  and lower extremity 5.5.  EXTREMITIES:  Peripheral pulses felt. .  No edema.   LABORATORY DATA:  CT of the head:  No acute intracranial findings.  Mild  , chronic microvascular white matter disease, chronic changes.  X-ray of  the lumbar spine:  No acute findings, stable  lumbar spondylosis.  CBC:  WBC 9.7, hemoglobin 12.9, hematocrit 38 and platelets 261,000,  neutrophils 67%.  Basic metabolic panel:  Sodium 140, potassium 4.3,  chloride 106, glucose 102, BUN 12, creatinine 0.3.   ASSESSMENT:  1. Near syncope, to rule out any arrhythmias.  2. Low-back pain status post fall with history of lumbar spondylosis.  3. Mild dehydration.  4. History of esophageal strictures.  5. History of depression.   PLAN:  1. Admit patient to telemetry.  2. Cycle cardiac markers.  3. Hydrate patient gently.  4. If pain persists, will need MRI of the low back.  5. Cardiology consult.  6. Resume home medication.  7. Further recommendations as the patient's condition evolves.      Eduard Clos, MD  Electronically Signed     ANK/MEDQ  D:  01/14/2008  T:  01/15/2008  Job:  [id number]

## 2010-07-20 NOTE — Assessment & Plan Note (Signed)
 HEALTHCARE                         GASTROENTEROLOGY OFFICE NOTE   ORVETTA, DANIELSKI                       MRN:          [id number]  DATE:05/28/2007                            DOB:          [birth year]    Mrs. Thorstenson is a 65 year old white female referred through the [facility] for evaluation.  She complains of dysphagia to  solids.  She has a history of GERD for which she is taking Prevacid.  She was doing well until January, when she discontinued her Prevacid for  approximately 5 days.  Since that time, she has been complaining of  intermittent dysphagia to solids.  She has occasional pyrosis.  She is  on no gastric irritants including nonsteroidals.  She underwent an  endoscopy in [place] in July 2007, and was told that she had polyps of  the stomach.   PAST MEDICAL HISTORY:  1. Arthritis.  2. Anxiety and depression.  3. She is status post cholecystectomy.   FAMILY HISTORY:  Noncontributory.   MEDICATIONS:  Lexapro, Prevacid, Lisinopril, baby aspirin.   ALLERGIES:  TEGRETOL.   She neither smokes nor drinks.  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   REVIEW OF SYSTEMS:  Positive for frequent cough and joints pains and  fatigue.   PHYSICAL EXAMINATION:  VITAL SIGNS:  Pulse 74, blood pressure 124/82,  weight 220.  HEENT: EOMI.  PERRLA.  Sclerae are anicteric.  Conjunctivae are pink.  NECK:  Supple without thyromegaly, adenopathy or carotid bruits.  CHEST:  Clear to auscultation and percussion without adventitious  sounds.  CARDIAC:  Regular rhythm; normal S1 S2.  There are no murmurs, gallops  or rubs.  ABDOMEN:  Bowel sounds are normoactive.  Abdomen is soft, nontender and  nondistended.  There are no abdominal masses, tenderness, splenic  enlargement or hepatomegaly.  EXTREMITIES:  Full range of motion.  No cyanosis, clubbing or edema.  RECTAL:  Deferred.   IMPRESSION:  1. Gastroesophageal reflux disease.  2. Dysphagia  possibly secondary to esophageal stricture.   RECOMMENDATION:  1. Continue Prevacid.  2. Upper endoscopy with dilatation as indicated.     Barbette Hair. Arlyce Dice, MD,FACG  Electronically Signed    RDK/MedQ  DD: 05/28/2007  DT: 05/28/2007  Job #: [id number]

## 2010-07-20 NOTE — Letter (Signed)
May 28, 2007    [name]   RE:  Joy Jimenez, Joy Jimenez  MRN:  [id number]  /  DOB:  1945-09-02   Dear Ms. Madero:   It is my pleasure to have treated you recently as a new patient in my  office.  I appreciate your confidence and the opportunity to participate  in your care.   Since I do have a busy inpatient endoscopy schedule and office schedule,  my office hours vary weekly.  I am, however, available for emergency  calls every day through my office.  If I cannot promptly meet an urgent  office appointment, another one of our gastroenterologists will be able  to assist you.   My well-trained staff are prepared to help you at all times.  For  emergencies after office hours, a physician from our gastroenterology  section is always available through my 24-hour answering service.   While you are under my care, I encourage discussion of your questions  and concerns, and I will be happy to return your calls as soon as I am  available.   Once again, I welcome you as a new patient and I look forward to a happy  and healthy relationship.    Sincerely,      Barbette Hair. Arlyce Dice, MD,FACG  Electronically Signed   RDK/MedQ  DD: 05/28/2007  DT: 05/28/2007  Job #: [id number]

## 2010-07-21 MED ORDER — ESCITALOPRAM OXALATE 20 MG PO TABS *I*
20.0000 mg | ORAL_TABLET | Freq: Every day | ORAL | Status: AC
Start: 2010-07-19 — End: ?

## 2010-11-04 ENCOUNTER — Ambulatory Visit: Payer: Self-pay | Admitting: Family Medicine

## 2010-12-02 LAB — POCT I-STAT, CHEM 8
BUN: 12
Calcium, Ion: 1.05 — ABNORMAL LOW
Chloride: 106
Creatinine, Ser: 1.3 — ABNORMAL HIGH
Glucose, Bld: 99
HCT: 43
Hemoglobin: 14.6
Potassium: 3.9
Sodium: 137
TCO2: 21

## 2010-12-07 LAB — DIFFERENTIAL
Basophils Absolute: 0
Basophils Relative: 0
Eosinophils Absolute: 0.3
Eosinophils Relative: 3
Lymphocytes Relative: 23
Lymphs Abs: 2.2
Monocytes Absolute: 0.7
Monocytes Relative: 7
Neutro Abs: 6.5
Neutrophils Relative %: 67

## 2010-12-07 LAB — URINE MICROSCOPIC-ADD ON

## 2010-12-07 LAB — URINALYSIS, ROUTINE W REFLEX MICROSCOPIC
Bilirubin Urine: NEGATIVE
Glucose, UA: NEGATIVE
Hgb urine dipstick: NEGATIVE
Ketones, ur: NEGATIVE
Nitrite: NEGATIVE
Protein, ur: NEGATIVE
Specific Gravity, Urine: 1.017
Urobilinogen, UA: 1
pH: 5.5

## 2010-12-07 LAB — CARDIAC PANEL(CRET KIN+CKTOT+MB+TROPI)
CK, MB: 1
CK, MB: 1.4
CK, MB: 1.5
Relative Index: 1.1
Relative Index: 1.3
Relative Index: INVALID
Total CK: 115
Total CK: 123
Total CK: 81
Troponin I: <0.01
Troponin I: <0.01
Troponin I: <0.01

## 2010-12-07 LAB — POCT I-STAT, CHEM 8
BUN: 12
Calcium, Ion: 1.16
Chloride: 106
Creatinine, Ser: 1.3 — ABNORMAL HIGH
Glucose, Bld: 102 — ABNORMAL HIGH
HCT: 38
Hemoglobin: 12.9
Potassium: 4.3
Sodium: 140
TCO2: 28

## 2010-12-07 LAB — BASIC METABOLIC PANEL
BUN: 11
BUN: 12
CO2: 25
CO2: 25
Calcium: 8.5
Calcium: 9.1
Chloride: 106
Chloride: 107
Creatinine, Ser: 1.01
Creatinine, Ser: 1.07
GFR calc Af Amer: >60
GFR calc Af Amer: >60
GFR calc non Af Amer: 52 — ABNORMAL LOW
GFR calc non Af Amer: 55 — ABNORMAL LOW
Glucose, Bld: 87
Glucose, Bld: 88
Potassium: 3.9
Potassium: 4.1
Sodium: 139
Sodium: 141

## 2010-12-07 LAB — CBC
HCT: 35.1 — ABNORMAL LOW
HCT: 35.6 — ABNORMAL LOW
HCT: 38.7
Hemoglobin: 11.8 — ABNORMAL LOW
Hemoglobin: 11.8 — ABNORMAL LOW
Hemoglobin: 12.8
MCHC: 33
MCHC: 33.1
MCHC: 33.6
MCV: 93.8
MCV: 94.2
MCV: 94.9
Platelets: 210
Platelets: 222
Platelets: 261
RBC: 3.74 — ABNORMAL LOW
RBC: 3.75 — ABNORMAL LOW
RBC: 4.11
RDW: 14.2
RDW: 14.2
RDW: 14.5
WBC: 10.3
WBC: 8.1
WBC: 9.7

## 2010-12-07 LAB — TSH: TSH: 4.195

## 2010-12-07 LAB — CK TOTAL AND CKMB (NOT AT ARMC)
CK, MB: 1
Relative Index: 0.9
Total CK: 115

## 2010-12-07 LAB — CORTISOL-AM, BLOOD
Cortisol - AM: 2.8 — ABNORMAL LOW
Cortisol - AM: 7

## 2010-12-07 LAB — LIPID PANEL
Cholesterol: 179
HDL: 56
LDL Cholesterol: 107 — ABNORMAL HIGH
Total CHOL/HDL Ratio: 3.2
Triglycerides: 81
VLDL: 16

## 2010-12-07 LAB — TROPONIN I: Troponin I: <0.01

## 2010-12-07 LAB — CORTISOL: Cortisol, Plasma: 32.4

## 2011-01-20 ENCOUNTER — Ambulatory Visit: Payer: Self-pay

## 2011-01-20 ENCOUNTER — Encounter: Payer: Self-pay | Admitting: Otolaryngology

## 2011-01-20 ENCOUNTER — Ambulatory Visit: Payer: Self-pay | Admitting: Otolaryngology

## 2011-01-20 VITALS — BP 150/80 | HR 75 | Resp 20 | Ht 64.0 in | Wt 225.0 lb

## 2011-01-20 DIAGNOSIS — I951 Orthostatic hypotension: Secondary | ICD-10-CM | POA: Insufficient documentation

## 2011-01-20 NOTE — Progress Notes (Signed)
Otolaryngology-- Head and Neck Surgery     We had the pleasure of seeing Tracie Turner on 01/20/2011 in the office for a chief complaint of dizziness and hearing loss.     HPI:        This is a 65 y.o. old female who presents with concerns of hearing loss and dizziness. She describes her dizzy sensation as light headedness, most notable when going from sitting or laying to standing. Occasionally these symptoms come on without positional changes, but are primarily associated with the above mentioned positional changes. She denies any vertiginous symptoms including room spinning, swaying, or visual disturbances. She has had bilateral tinnitus for many years which is unchanged. It does not keep her awake at night. She also notes a progressive hearing loss in both ears over the past few years. She preferentially uses her left ear for the phone. She has no history of exposure to loud noises. The patient notes that she thinks she had a procedure on her ears when she was 47 months old, but does not recall the details. Also, she thinks she had a TM perforation in her 53s, but this healed spontaneously. Otherwise there is no history of recurrent ear infections or cholesteatoma. Audiometric analysis today revealed a b/l sensorineural hearing loss in the high frequencies around 60dB with 92% speech descriminiation on the left and 72% on the right. VNG testing was essentially normal.     History:       has no past medical history on file.   has no past surgical history on file.   does not have a smoking history on file. She does not have any smokeless tobacco history on file.  family history is not on file.    Allergies:   Review of patient's allergies indicates not on file.     Medications:     Current Outpatient Prescriptions   Medication Sig   . escitalopram (LEXAPRO) 20 MG tablet Take 1 tablet (20 mg total) by mouth daily         ROS:     A complete review of systems was taken and is documented elsewhere in the  electronic chart.     Physical Exam:   GEN: NAD    EARS:both TM's normal landmarks and mobility and normal canals bilaterally. No nystagmus with head thrust.     NOSE: Nares normal. Septum midline. Mucosa normal. No drainage or sinus tenderness.     MOUTH/THROAT: No masses, exudates or lesions.     NECK:no lymphadenopathy    The patient is well developed, well nourished, and in no acute distress.  They are able to communicate without assistance or hoaresness.  The patient had a calm affect.  Head is atraumatic and normal cephalic.  There was no sinus tenderness.  The salivary glands appeared normal.  Facial symmetry was normal bilaterally.      Extraocular movements were intact, there was no nystagmus at rest.    Right ear:  The pinna was normal.  The external auditory canal was dry and non-erythematous.  The tympanic membrane was intact and mobile with pneumatic otoscopy.  No fluid was visible behind the tympanic membrane.      Left ear:  The pinna was normal.  The external auditory canal was dry and non-erythematous.  The tympanic membrane was intact and mobile with pneumatic otoscopy.  No fluid was visible behind the tympanic membrane.      Tuning fork:  Using a 512 Hz tuning fork, the  Weber localized to the midline.  Rinne testing revealed that air conduction was heard louder than bone conduction in both ears (Rinne positive, a normal finding).    Nose:  Turbinates was healthy and intact.  Mucosa appeared normal, the septum was near midline and there was no visible perforation.  No polyps were visible.    Oral cavity:  Normal healthy mucosa.  No visible ulcers.  Lips were pink and moist.  Tongue was mobile with no visible masses.  Hard and soft palates were intact.    Neck:  Full range of motion with the trachea in the midline position.  There was no thyromegaly lymphadenopathy.      Because of her dizziness symptoms a VNG was performed.    Video/Electro-nystagmography (VNG/ENG) Report    Caloric testing     Weakness:  11% weaker on the left (normal range)    Direction preponderance:  11% stronger on the right (also normal)    Horizontal saccades:  Normal accuracy, latency, and peak velocity    Horizontal sinusoidal smooth pursuit:  Normal    Sponaneous nystagmus: absent in light and darkness    Optokinentic nystagmus (OKN): normal response to 30 deg/s rightward and leftward motion.    Dix-Halpike: normal, no nystagmus in right or left head hanging position.    Cervical vestibular evoke myopotentials (cVEMP) were performed to test the integrity of the inferior vestibular nerve.  Potentials were recorded from the sternocleido mastoid muscles during a series of auditory clicks delivered to the ears.  The patient had minimal responses at 105 dB in both ears.      Summary:  She had absent vestibular evoke myopotentials which is a nonspecific finding in this patient.  The caloric function was normal bilaterally.      Assessment:      This is a 66 y.o. old female that presents with complaints of light headedness associated with going from laying or sitting to standing. No evidence of vertigo and normal VNG studies. 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       Plan:     [name] would likely benefit from a hearing aid.  No Further ENT intervention is required. She may follow up on an as needed basis.     Tracie Climes, MD    I have seen and examined the patient with Dr. Franchot Erichsen and agree with the above.
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
## 2011-03-10 ENCOUNTER — Ambulatory Visit: Payer: Self-pay | Admitting: Audiologist

## 2011-04-12 ENCOUNTER — Ambulatory Visit (INDEPENDENT_AMBULATORY_CARE_PROVIDER_SITE_OTHER): Payer: Medicare Other | Admitting: Family

## 2011-04-12 ENCOUNTER — Ambulatory Visit (HOSPITAL_BASED_OUTPATIENT_CLINIC_OR_DEPARTMENT_OTHER)
Admission: RE | Admit: 2011-04-12 | Discharge: 2011-04-12 | Disposition: A | Discharge status: Home / Self Care | Payer: Medicare Other | Source: Ambulatory Visit | Attending: Family | Admitting: Family

## 2011-04-12 ENCOUNTER — Encounter: Payer: Self-pay | Admitting: Family

## 2011-04-12 DIAGNOSIS — K449 Diaphragmatic hernia without obstruction or gangrene: Secondary | ICD-10-CM | POA: Insufficient documentation

## 2011-04-12 DIAGNOSIS — R0609 Other forms of dyspnea: Secondary | ICD-10-CM

## 2011-04-12 DIAGNOSIS — R0989 Other specified symptoms and signs involving the circulatory and respiratory systems: Secondary | ICD-10-CM

## 2011-04-12 DIAGNOSIS — I10 Essential (primary) hypertension: Secondary | ICD-10-CM

## 2011-04-12 DIAGNOSIS — R1310 Dysphagia, unspecified: Secondary | ICD-10-CM

## 2011-04-12 DIAGNOSIS — F329 Major depressive disorder, single episode, unspecified: Secondary | ICD-10-CM

## 2011-04-12 DIAGNOSIS — F3289 Other specified depressive episodes: Secondary | ICD-10-CM

## 2011-04-12 DIAGNOSIS — K219 Gastro-esophageal reflux disease without esophagitis: Secondary | ICD-10-CM

## 2011-04-12 DIAGNOSIS — F32A Depression, unspecified: Secondary | ICD-10-CM | POA: Insufficient documentation

## 2011-04-12 DIAGNOSIS — R0600 Dyspnea, unspecified: Secondary | ICD-10-CM

## 2011-04-12 DIAGNOSIS — E785 Hyperlipidemia, unspecified: Secondary | ICD-10-CM

## 2011-04-12 DIAGNOSIS — I951 Orthostatic hypotension: Secondary | ICD-10-CM | POA: Insufficient documentation

## 2011-04-12 DIAGNOSIS — R5383 Other fatigue: Secondary | ICD-10-CM

## 2011-04-12 LAB — TSH: TSH: 2.116 u[IU]/mL (ref 0.350–4.500)

## 2011-04-12 LAB — BASIC METABOLIC PANEL
BUN: 16 mg/dL (ref 6–23)
CO2: 28 mEq/L (ref 19–32)
Calcium: 9.3 mg/dL (ref 8.4–10.5)
Chloride: 102 mEq/L (ref 96–112)
Creat: 1.09 mg/dL (ref 0.50–1.10)
Glucose, Bld: 87 mg/dL (ref 70–99)
Potassium: 4.6 mEq/L (ref 3.5–5.3)
Sodium: 140 mEq/L (ref 135–145)
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
## 2011-04-12 NOTE — Assessment & Plan Note (Signed)
This is currently stable on lexapro, continue same.

## 2011-04-12 NOTE — Patient Instructions (Signed)
Please follow up in 1 month for a medicare wellness visit.   Call if you develop worsening shortness of breath.   Complete blood work prior to leaving today.  Welcome to [facility]!

## 2011-04-12 NOTE — Assessment & Plan Note (Signed)
She is noted to have very mild lower extremity edema.  EKG performed today show sinus rhythm, no EKG available for comparison. Will obtain CXR to evaluate for volume overload.  She is scheduled to see her cardiologist on 2/18 and I have asked her to further discuss this with her cardiologist at that time.

## 2011-04-12 NOTE — Assessment & Plan Note (Signed)
Continue PPI, defer further management to GI.

## 2011-04-12 NOTE — Assessment & Plan Note (Signed)
She is scheduled to see GI this month,  I have encouraged her to keep this appointment for further evaluation.  She is concerned about some fullness in her neck, and I have ordered a thryoid ultrasound and TSH to further evaluate this.

## 2011-04-12 NOTE — Assessment & Plan Note (Signed)
On statin, obtain flp, lft. 

## 2011-04-13 ENCOUNTER — Telehealth: Payer: Self-pay | Admitting: Family

## 2011-04-13 LAB — LIPID PANEL
Cholesterol: 158 mg/dL (ref 0–200)
HDL: 62 mg/dL (ref >39)
LDL Cholesterol: 74 mg/dL (ref 0–99)
Total CHOL/HDL Ratio: 2.5 Ratio
Triglycerides: 108 mg/dL (ref <150)
VLDL: 22 mg/dL (ref 0–40)

## 2011-04-13 LAB — HEPATIC FUNCTION PANEL
ALT: 15 U/L (ref 0–35)
AST: 26 U/L (ref 0–37)
Albumin: 4.1 g/dL (ref 3.5–5.2)
Alkaline Phosphatase: 72 U/L (ref 39–117)
Bilirubin, Direct: 0.1 mg/dL (ref 0.0–0.3)
Indirect Bilirubin: 0.6 mg/dL (ref 0.0–0.9)
Total Bilirubin: 0.7 mg/dL (ref 0.3–1.2)
Total Protein: 7.2 g/dL (ref 6.0–8.3)

## 2011-04-13 NOTE — Telephone Encounter (Signed)
Left message for nurse with Dr. Rennis Golden 2/18 at [facility] to discuss pt.

## 2011-04-13 NOTE — Telephone Encounter (Addendum)
Please call pt and let her know that her blood work and chest x-ray is normal. Cxr shows hiatal hernia which she already is aware of.

## 2011-04-13 NOTE — Telephone Encounter (Signed)
Notified pt. Pt wanted to know what could be causing her shortness of breath. Advised pt per Sandford Craze, FNP that she has placed a call to her cardiologist for further discussion of symptoms and we will let her know when we hear back from them. Advised pt to come in for evaluation if SOB worsens. Pt voices understanding and states she has a f/u with her cardiologist on 04/25/11.

## 2011-04-13 NOTE — Telephone Encounter (Signed)
Left message on machine to return my call. 

## 2011-04-20 ENCOUNTER — Ambulatory Visit (HOSPITAL_BASED_OUTPATIENT_CLINIC_OR_DEPARTMENT_OTHER)
Admission: RE | Admit: 2011-04-20 | Discharge: 2011-04-20 | Disposition: A | Discharge status: Home / Self Care | Payer: Medicare Other | Source: Ambulatory Visit | Attending: Family | Admitting: Family

## 2011-04-20 DIAGNOSIS — E049 Nontoxic goiter, unspecified: Secondary | ICD-10-CM | POA: Insufficient documentation

## 2011-04-20 DIAGNOSIS — R209 Unspecified disturbances of skin sensation: Secondary | ICD-10-CM

## 2011-04-20 DIAGNOSIS — E041 Nontoxic single thyroid nodule: Secondary | ICD-10-CM

## 2011-04-20 DIAGNOSIS — M542 Cervicalgia: Secondary | ICD-10-CM | POA: Insufficient documentation

## 2011-04-20 DIAGNOSIS — R1310 Dysphagia, unspecified: Secondary | ICD-10-CM

## 2011-04-20 NOTE — Telephone Encounter (Signed)
Dr. Rennis Golden aware and will plan to evaluate pt at upcoming cardiology apt.

## 2011-04-20 NOTE — Telephone Encounter (Signed)
Cardiology did not return my call.  I have sent note directly to Dr. Rennis Golden in epic re: symptoms and need for further cardiac work up. and faxed copy of EKG.

## 2011-04-22 ENCOUNTER — Telehealth: Payer: Self-pay | Admitting: Family

## 2011-04-22 NOTE — Telephone Encounter (Signed)
Please call pt and let her know that I have reviewed thyroid ultrasound.  It shows a small nodule which I am not concerned about.  The thyroid blood test is normal.

## 2011-04-22 NOTE — Telephone Encounter (Signed)
Call placed to patient at [phone], she was informed per [facility]'s instructions. Patient stated that she has additional concerns and has to think on it and she will call back.

## 2011-05-13 ENCOUNTER — Encounter: Payer: Medicare Other | Admitting: Family

## 2011-05-18 ENCOUNTER — Telehealth: Payer: Self-pay | Admitting: *Deleted

## 2011-05-18 DIAGNOSIS — E041 Nontoxic single thyroid nodule: Secondary | ICD-10-CM

## 2011-05-18 NOTE — Telephone Encounter (Signed)
I have placed formal request for consult with Dr. Roanna Raider and have asked helen to fax this info with request.

## 2011-05-18 NOTE — Telephone Encounter (Signed)
Attempted to reach pt and left detailed message re: referral and to call us if she hasn't been contacted with appt date by next Wednesday.

## 2011-05-18 NOTE — Telephone Encounter (Signed)
Pt left voice message requesting [name] to fax thyroid labs, u/s and office notes to Dr Roanna Raider (endocrinologist) for further evaluation of her thyroid nodule to r/o thyroid cancer. Pt states Dr Oneita Kras office wants this info before they will schedule pt an appt.  Please advise.

## 2011-05-25 ENCOUNTER — Telehealth: Payer: Self-pay | Admitting: *Deleted

## 2011-05-25 DIAGNOSIS — E041 Nontoxic single thyroid nodule: Secondary | ICD-10-CM

## 2011-05-25 NOTE — Telephone Encounter (Signed)
Pt called stating she spoke with [facility] and is aware that Dr Roanna Raider is no longer with them. She states she is not happy with the length of time it is taking them to schedule a consultation. She called [facility] and scheduled appt with Dr Brock Bad for 05/31/11 @ 1pm and requests that we fax information to them at [phone].  Previous referral cancelled and new referral entered for Dr Wende Bushy.

## 2011-09-23 DIAGNOSIS — R920 Mammographic microcalcification found on diagnostic imaging of breast: Secondary | ICD-10-CM | POA: Insufficient documentation

## 2012-08-29 ENCOUNTER — Other Ambulatory Visit (HOSPITAL_BASED_OUTPATIENT_CLINIC_OR_DEPARTMENT_OTHER): Payer: Self-pay | Admitting: Family Medicine

## 2012-08-29 DIAGNOSIS — E041 Nontoxic single thyroid nodule: Secondary | ICD-10-CM

## 2012-09-05 ENCOUNTER — Ambulatory Visit (HOSPITAL_BASED_OUTPATIENT_CLINIC_OR_DEPARTMENT_OTHER)
Admission: RE | Admit: 2012-09-05 | Discharge: 2012-09-05 | Disposition: A | Discharge status: Home / Self Care | Payer: Medicare Other | Source: Ambulatory Visit | Attending: Family Medicine | Admitting: Family Medicine

## 2012-09-05 DIAGNOSIS — E041 Nontoxic single thyroid nodule: Secondary | ICD-10-CM

## 2012-11-06 ENCOUNTER — Telehealth: Payer: Self-pay | Admitting: Neurology

## 2012-11-06 NOTE — Telephone Encounter (Signed)
Pt needs new referral.   Last seen 02-07-08.

## 2012-12-13 ENCOUNTER — Ambulatory Visit (INDEPENDENT_AMBULATORY_CARE_PROVIDER_SITE_OTHER): Payer: Medicare Other | Admitting: Neurology

## 2012-12-13 ENCOUNTER — Encounter: Payer: Self-pay | Admitting: Neurology

## 2012-12-13 VITALS — BP 128/77 | HR 69 | Temp 97.8°F | Ht 63.0 in | Wt 219.0 lb

## 2012-12-13 DIAGNOSIS — Z8673 Personal history of transient ischemic attack (TIA), and cerebral infarction without residual deficits: Secondary | ICD-10-CM

## 2012-12-13 DIAGNOSIS — R42 Dizziness and giddiness: Secondary | ICD-10-CM | POA: Insufficient documentation

## 2012-12-13 DIAGNOSIS — R200 Anesthesia of skin: Secondary | ICD-10-CM | POA: Insufficient documentation

## 2012-12-13 DIAGNOSIS — M25569 Pain in unspecified knee: Secondary | ICD-10-CM | POA: Insufficient documentation

## 2012-12-13 DIAGNOSIS — R209 Unspecified disturbances of skin sensation: Secondary | ICD-10-CM

## 2012-12-13 DIAGNOSIS — G44209 Tension-type headache, unspecified, not intractable: Secondary | ICD-10-CM

## 2012-12-13 DIAGNOSIS — G459 Transient cerebral ischemic attack, unspecified: Secondary | ICD-10-CM

## 2012-12-13 MED ORDER — ESCITALOPRAM OXALATE 20 MG PO TABS: 40.0000 mg | ORAL_TABLET | Freq: Every day | ORAL | Status: DC

## 2012-12-13 NOTE — Patient Instructions (Signed)
Consider increasing Lexapro to 40 mg daily but I have advised her to see a psychiatrist to consider changing to alternative antidepressants. Check EMG nerve conduction study for neuropathy an MRI scan of the brain Mares for cerebrovascular disease. X-ray of the right ankle for a heel fracture. Return for followup in 6 weeks or call earlier if necessary.

## 2012-12-13 NOTE — Progress Notes (Signed)
[facility] [address] [place]. [place] [address] [phone]       OFFICE CONSULT NOTE  Ms. Deshunda Thackston Date of Birth:  19-Sep-1945 Medical Record Number:  [id number]   Referring MD:  Domingo Pulse  Reason for Referral:  Numbness,headache,dizziness  HPI: 66 year Caucasian lady with numbness in both feet since last 2-3 years.he describes tingling, numbness and pain off and on which is not progressive. He describes dizzziness, gait imbalance with leaning to 1 side off and on but is able to support himself and not fall.He reports some pain in calf and thigh muscles off and on but no cramps.He has not had EMG/NCV or MRI of his back. He denies back injury, fall, radicular pain or bladder control issues. He describes intermittent mild bifrontal headaches moderate 5/10 lasting 3-5 minutes only and not requiring pain medicines.She states she was seeing a neurologist at [facility] medical centre in [facility] who has retired and she is unable to name him and I do not have his records.She fell in June 2014 and hurt her right heel which is still hurting but has not had any X rays.She has h/o TIA in November 2009 and saw Dr Sandria Manly and went up extensive w/u including MRIs of brain and spine showing mild small vessel disease and disc degenerative changes without significant compression or vascular occlusion on MRA.She has sleep apnoea but does not wear her CPAP mask.  ROS:   14 system review of systems is positive for fatigue,chest pain, leg swelling,hearing loss,trouble swallowing,itching,shortness of breath,incontinence,diarrhoea,constipation,headache,numbness,weakness,dizziness, difficulty,insomnia,sleepiness,snoring  PMH:  Past Medical History  Diagnosis Date  . Depression   . GERD (gastroesophageal reflux disease)   . Neuromuscular disorder 2007    hiatal hernia  . Heart murmur   . Hyperlipidemia   . Mitral valve disorder 2007    "mitral valve leakage"  . Diverticulosis    . History of chicken pox   . Mini stroke 01/14/08  . Orthostatic hypotension   . Achilles tendinitis   . Low back pain     Social History:  History   Social History  . Marital Status: Divorced    Spouse Name: N/A    Number of Children: 1  . Years of Education: N/A   Occupational History  . Not on file.   Social History Main Topics  . Smoking status: Never Smoker   . Smokeless tobacco: Never Used  . Alcohol Use: Yes     Comment: once or twice a year  . Drug Use: No  . Sexual Activity: No   Other Topics Concern  . Not on file   Social History Narrative   Caffeine use:  3 beverages a week   Regular exercise:  No   [employer] graduate   Divorced   Writer/producer- retired, does some Pharmacologist on the side.   Has a daughter who lives in [place].   3 sisters- live locally.          Medications:   Current Outpatient Prescriptions on File Prior to Visit  Medication Sig Dispense Refill  . aspirin EC 81 MG tablet Take 81 mg by mouth daily.      . rosuvastatin (CRESTOR) 10 MG tablet Take 10 mg by mouth daily.       No current facility-administered medications on file prior to visit.    Allergies:   Allergies  Allergen Reactions  . Tegretol [Carbamazepine] Other (See Comments)    Blurred vision    Physical Exam General:  obese middle aged lady, seated, in no evident distress Head no deformity ENT. Orohparynx benign Neck: supple with no carotid or supraclavicular bruits Cardiovascular: regular rate and rhythm, no murmurs Musculoskeletal: no deformity Skin:  no rash/petichiae Vascular:  Normal pulses all extremities Filed Vitals:   12/13/12 1243  BP: 128/77  Pulse: 69  Temp: 97.8 F (36.6 C)    Neurologic Exam Mental Status: Awake and fully alert. Oriented to place and time. Recent and remote memory intact. Attention span, concentration and fund of knowledge appropriate. Mood and affect appropriate. MMSE 29/30. Geriatric Depession scale 9. Cranial  Nerves: Fundoscopic exam reveals sharp disc margins. Pupils equal, briskly reactive to light. Extraocular movements full without nystagmus. Visual fields full to confrontation. Hearing intact. Facial sensation intact. Face, tongue, palate moves normally and symmetrically.  Motor: Normal bulk and tone. Normal strength in all tested extremity muscles. Sensory.: diminished  touch and pinprick over toes bilaterally and vibratory sensation. Rhomberg`s negative.  Coordination: Rapid alternating movements normal in all extremities. Finger-to-nose and heel-to-shin performed accurately bilaterally. Gait and Station: Arises from chair without difficulty. Stance is normal. Gait demonstrates normal stride length and balance . Not able to heel, toe and tandem walk without difficulty.  Reflexes: 1+ and symmetric except right ankle jerk is depressed. Toes downgoing.     ASSESSMENT: 29 year lady with bilateral foot paresthesias of undetermined etiology and chronic mild tension headaches and memory impairment likely from depression and untreated sleep apnoea..Remote h/o TIA in 2009    PLAN: Consider increasing Lexapro to 40 mg daily but I have advised her to see a psychiatrist to consider changing to alternative antidepressants. Check EMG nerve conduction study for neuropathy an MRI scan of the brain  for cerebrovascular disease. X-ray of the right ankle for a heel fracture. Return for followup in 6 weeks or call earlier if necessary.

## 2012-12-18 ENCOUNTER — Ambulatory Visit: Payer: Medicare Other | Admitting: Neurology

## 2012-12-18 ENCOUNTER — Ambulatory Visit (INDEPENDENT_AMBULATORY_CARE_PROVIDER_SITE_OTHER): Payer: Medicare Other | Admitting: Neurology

## 2012-12-18 ENCOUNTER — Encounter (INDEPENDENT_AMBULATORY_CARE_PROVIDER_SITE_OTHER): Payer: Self-pay | Admitting: Radiology

## 2012-12-18 DIAGNOSIS — R202 Paresthesia of skin: Secondary | ICD-10-CM

## 2012-12-18 DIAGNOSIS — R200 Anesthesia of skin: Secondary | ICD-10-CM

## 2012-12-18 DIAGNOSIS — Z0289 Encounter for other administrative examinations: Secondary | ICD-10-CM

## 2012-12-18 DIAGNOSIS — R209 Unspecified disturbances of skin sensation: Secondary | ICD-10-CM

## 2012-12-18 NOTE — Procedures (Signed)
    [facility]  NCS (NERVE CONDUCTION STUDY) WITH EMG (ELECTROMYOGRAPHY) REPORT   STUDY DATE: 12/18/2012 PATIENT NAME: Joy Jimenez DOB: 14-Jul-1945 MRN: [id number]    TECHNOLOGIST: Kaylyn Lim ELECTROMYOGRAPHER: Levert Feinstein M.D.  CLINICAL INFORMATION:  67 years old female with bilateral feet paresthesia, chronic low back pain, gait difficulty  On examination, bilateral lower extremity motor strength was normal, deep tendon reflexes were present and symmetric, only mild decreased pinprick at bilateral first toe, otherwise there was no significant sensory loss  FINDINGS: NERVE CONDUCTION STUDY: Bilateral sural sensory responses were normal. Bilateral peroneal to EDB motor responses were normal.  Bilateral tibial motor responses demonstrated normal C. map amplitude stimulating at distal side,   severely decreased  C. map amplitude stimulating at proximal side    NEEDLE ELECTROMYOGRAPHY: Selected needle examination was performed at right lower extremity muscles, and right lumbosacral paraspinal muscles.   Needle examination of right tibialis anterior, tibialis posterior, medial gastrocnemius, vastus lateralis, biceps femoris long head was normal. There was no spontaneous activity at right lumbosacral paraspinal muscles, right L4, L5, S1.  IMPRESSION: This is a normal study. There is no electrodiagnostic evidence of large fiber peripheral neuropathy, or right lumbosacral radiculopathy   INTERPRETING PHYSICIAN:   Levert Feinstein M.D. Ph.D. [facility] [address] [place], [place] [address] [phone]

## 2012-12-20 NOTE — Progress Notes (Signed)
Quick Note:  I called pt and relayed the normal NCS/EMG results. She asked about MRI. Will check with Renee in MRI. ______

## 2012-12-31 ENCOUNTER — Ambulatory Visit
Admission: RE | Admit: 2012-12-31 | Discharge: 2012-12-31 | Disposition: A | Discharge status: Home / Self Care | Payer: Medicare Other | Source: Ambulatory Visit | Attending: Neurology | Admitting: Neurology

## 2012-12-31 DIAGNOSIS — G459 Transient cerebral ischemic attack, unspecified: Secondary | ICD-10-CM

## 2012-12-31 DIAGNOSIS — R42 Dizziness and giddiness: Secondary | ICD-10-CM

## 2012-12-31 DIAGNOSIS — M25569 Pain in unspecified knee: Secondary | ICD-10-CM

## 2012-12-31 DIAGNOSIS — Z8673 Personal history of transient ischemic attack (TIA), and cerebral infarction without residual deficits: Secondary | ICD-10-CM

## 2012-12-31 MED ORDER — GADOBENATE DIMEGLUMINE 529 MG/ML IV SOLN
10.0000 mL | Freq: Once | INTRAVENOUS | Status: AC | PRN
  Administered 2012-12-31: 10 mL via INTRAVENOUS

## 2013-01-03 NOTE — Progress Notes (Signed)
Quick Note:  I called pt and gave her the results of ankle xray. Also no results on MRI/MRA. ______

## 2013-01-10 ENCOUNTER — Telehealth: Payer: Self-pay | Admitting: Neurology

## 2013-01-11 NOTE — Telephone Encounter (Signed)
Called patient and let she will be in on 01-18-2013, to go over results with Dr.Sethi and for a 6 week visit.

## 2013-01-18 ENCOUNTER — Ambulatory Visit (INDEPENDENT_AMBULATORY_CARE_PROVIDER_SITE_OTHER): Payer: Medicare Other | Admitting: Neurology

## 2013-01-18 ENCOUNTER — Encounter: Payer: Self-pay | Admitting: Neurology

## 2013-01-18 ENCOUNTER — Encounter (INDEPENDENT_AMBULATORY_CARE_PROVIDER_SITE_OTHER): Payer: Self-pay

## 2013-01-18 VITALS — BP 114/70 | HR 74 | Ht 63.0 in | Wt 220.0 lb

## 2013-01-18 DIAGNOSIS — R42 Dizziness and giddiness: Secondary | ICD-10-CM

## 2013-01-18 NOTE — Patient Instructions (Signed)
Continue aspirin for stroke prevention and Crestor for hyperlipidemia with LDL cholesterol goal below 100 mg percent. I advised her to get up slowly and to orthostatic problems exercises for her dizziness. She was advised to followup with her primary care physician to evaluate her foot pain and to see a psychiatrist to optimize her depression medications.
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
## 2013-07-08 ENCOUNTER — Institutional Professional Consult (permissible substitution): Payer: Medicare Other | Admitting: Pulmonary Disease

## 2014-07-31 ENCOUNTER — Encounter: Payer: Self-pay | Admitting: Gastroenterology

## 2014-12-13 ENCOUNTER — Inpatient Hospital Stay
Admit: 2014-12-13 | Discharge: 2014-12-14 | Disposition: A | Discharge status: Home / Self Care | Payer: MEDICARE | Attending: MD

## 2014-12-13 DIAGNOSIS — F602 Antisocial personality disorder: Secondary | ICD-10-CM
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
## 2014-12-13 NOTE — ED Provider Notes (Signed)
History   Monique Huffman is a 69 y.o. year old female presenting with Medication Refill  .    HPI Monique Huffman is a 69 y.o. year old female who presents the emergency department requesting a medication refill request. She just arrived into the area yesterday by bus, apparently from [place]. She recently moved across the country from the [place] by train and, upon transferring to a bus to [employer], reports that some of her baggage was misplaced. She reports that her medications were in the baggage and she anticipates that she will be reunited with her baggage and medications and one or 2 days. She requests enough medication to tide her over. The patient is carrying a medication list from a visit on 11/17/14 from [facility], M.D. from [facility] in [place], [place] [phone].    History reviewed. No pertinent past medical history. she reports a history of hypothyroidism and is concerned about the thyroid levels. She is requesting referral to an endocrinologist. She is a history of gouty arthritis. 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    History reviewed. No pertinent past surgical history.    History reviewed. No pertinent family history.    Social History   Substance Use Topics   . Smoking status: None   . Smokeless tobacco: None   . Alcohol use None     Other Social History Comments:    she has decided to relocate from the [place] to the [facility] [place] area where her daughter apparently lives. I am informed by [facility]ique [facility]man that the patient's daughter moved from [place] to [place] one month ago. Monique Huffman failed to tell her daughter that she was moving to [place] and, upon  her arrival to the [place] area yesterday, discovered that her daughter had taken off on a trip. She has no local primary care provider or friends in the area. She apparently spent last night in a [facility] in reports having no money for lodging tonight or for food.    Discharge Medication List as of 12/13/2014  6:57 PM      CONTINUE these medications which have NOT CHANGED    Details   ALLOPURINOL ORAL Take 300 mg by mouth daily., Until Discontinued, Historical Med      ascorbic acid, vitamin C, (ASCORBIC ACID WITH ROSE HIPS) 500 MG tablet Take 500 mg by mouth daily., Until Discontinued, Historical Med      cholecalciferol, vitamin D3, (VITAMIN D3) 5,000 unit Tab Take by mouth., Until Discontinued, Historical Med      cyanocobalamin (VITAMIN B-12) 1000 MCG tablet Take 1,000 mcg by mouth daily., Until Discontinued, Historical Med      DEXLANSOPRAZOLE (DEXILANT ORAL) Take 60 mg by mouth daily., Until Discontinued, Historical Med      ESCITALOPRAM OXALATE (LEXAPRO ORAL) Take 30 mg by mouth daily., Until Discontinued, Historical Med      LEVOTHYROXINE SODIUM (SYNTHROID ORAL) Take by mouth. Pt states she takes 50 mg, Until Discontinued, Historical Med             Review of Systems medical medication refill request. Patient describes a whooshing sound that seems to originate from the frontal scalp and forehead region over the last 6 months since a fall injury. The condition has not  worsened, but it also has not improved. She is requesting a CT study and neurology referral for this condition. Denies visual disturbances or hearing disturbances, confusion or disorientation, neck pain or stiffness since the fall injury. Regards to her hypothyroidism, and not taking her thyroid supplements in 10 days, Monique [facility] requests a referral to an endocrinologist. Denies shortness of breath or cough. Denies chest pain or palpitations. She does report some nausea and abdominal discomfort, but she happily requested a meal and consumed a  full meal and a half while in the emergency department, without observed nausea, vomiting, or increasing abdominal pain after eating.    Physical Exam     Visit Vitals   . BP 153/85   . Pulse 94   . Temp 36.2 ?C (97.2 ?F) (Temporal)   . Resp 14   . SpO2 96%       Physical Exam alert and oriented ?3, pleasant and cooperative. The head is atraumatic. Range of motion intact at the neck. She ambulates without ataxia. Neurologically intact.    ED Course   Procedures  I wrote prescriptions for allopurinol 300 mg daily ?2 weeks; Dexilant 60 mg daily for 2 weeks; Lexapro 30 mg daily for 2 weeks; Synthroid 50 ?g daily for 2 weeks; I referred her to the on-call primary care provider, Dr. Cecille Huffman.    MDM it has become apparent that Monique Huffman has a story that is not reliable. She has been withholding information, most significantly, her visit to Monique Huffman earlier today and her visit 2 nights ago to Mon[facility] Huffman where a thorough examination and laboratory work were performed. She is now requesting a thorough workup including CT scan regarding, locations from a fall injury 6 months ago. A workup for the whooshing sound over the last 6 months seems inappropriate at this time, and it would be my preference to refer her to a primary care provider who can begin to sort out the host of medical problems this patient has, including social services for homelessness. 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    Labs Reviewed - No data to display    No orders to display       ED Disposition: Discharge I wrote medication refills for the above-mentioned medications. She was referred to the on-call primary care provider. Upon discharge Monique Huffman appear surprised that  I wasn't going to workup her whooshing sound that was originating from the frontal scalp since her ground-level fall 6 months ago. She requested a CT scan as well as a referral to a neurologist and endocrinologist. I informed Soleil that a primary care provider could make these referrals. I encouraged her to follow up with the on-call primary care provider to determine whether neurology or endocrinology referrals for appropriate. About this time there was a telephone call from [facility], informing us that we might be visited by Jaleesa. She apparently was evaluated there earlier this morning after she called for ambulance from her hotel, requesting emergency department evaluation. In examining records from [facility], and also from [place] where Tanishia was examined 2 nights ago and received an extensive workup, it became apparent that this was her third emergency department visits in 2 days with a host of chief complaints. I reviewed the  laboratory findings and workup from [facility] When I approached the patient about these 2 previous visits, she informs me that she was not adequately worked up or treated and she is requesting a third evaluation. It is also become apparent that she has no orbital money to afford a hotel or food. She is requesting social services to help with these problems, until the daughter (who is unaware of Aleane's presence in [place]) returns and can help the patient sort out these challenges.    She was able to call a friend who was willing to provide a credit card payment to a [facility] where she will be staying until Monday when Yared hopes that social services including [facility] Department and perhaps [facility] Department will be open and available to begin to sort out her medical problems and his social problems. It is also hopeful to the patient that her daughter will return by then  and that her medications will arrive. She was sent out with prescriptions, but has no way of filling the prescriptions, according to the patient. She apparently has been without these medications for some time. She reports feeling better after having eaten a meal and a half and we called for a taxi and. For a taxi to her [facility]. Condition on discharge: Stable    Diagnoses that have been ruled out:   None   Diagnoses that are still under consideration:   None   Final diagnoses:   Encounter for medication refill   Sociopathic personality disorder        Shellee Milo, MD  12/14/14 1029

## 2014-12-13 NOTE — Discharge Instructions (Signed)
Medicine Refill in Emergency Department  We have refilled your medicine today, but it is best for you to get refills through your primary health care provider's office. In the future, please plan ahead so you do not need to get refills from the emergency department.  If the medicine we refilled was a maintenance medicine, you may have received only enough to get you by until you are able to see your regular health care provider.  Document Released: 06/10/2003 Document Revised: 10/25/2013 Document Reviewed: 05/31/2013  ExitCare? Patient Information ?2015 [employer]. This information is not intended to replace advice given to you by your health care provider. Make sure you discuss any questions you have with your health care provider.    Follow-up with the on-call PCP for additional problems or for referral to neurology or endocrinology services  Follow-up on Monday with [facility] Department or with [facility] to inquire about additional resources

## 2014-12-13 NOTE — ED Notes (Addendum)
Pt requesting food, menu given. Pt ordered large meal, including dessert.

## 2014-12-13 NOTE — ED Notes (Addendum)
Pt tells me her medications are at the [facility] in [place]. She tells me she was unable to take her belongings from there b/c there were no carts there. She's been out of her medications for ten days. She tells me she has shortness of breath at times, she vomitted once yesterday  And she has abdominal pain for about ten days, since she's been traveling. Speaks in full sentences. respirs unlabored. Skin p/w/d. NAD.

## 2014-12-13 NOTE — ED Notes (Signed)
Meal to pt

## 2014-12-13 NOTE — ED Notes (Signed)
Pt tells me she's still eating, but a friend of her's will pay for a couple of nights at the [facility]. MD notified.

## 2014-12-13 NOTE — ED Notes (Signed)
Pt made arrangements at [employer] in [place] and we will cab voucher her there. Pt was able to eat all of her meal. She is requesting food to go and a food voucher, but i informed her that i am unable to provide that for her.

## 2015-03-16 ENCOUNTER — Emergency Department (HOSPITAL_COMMUNITY)
Admission: EM | Admit: 2015-03-16 | Discharge: 2015-03-16 | Disposition: A | Discharge status: Home / Self Care | Payer: Medicare Other | Attending: Emergency Medicine | Admitting: Emergency Medicine

## 2015-03-16 ENCOUNTER — Emergency Department (HOSPITAL_COMMUNITY): Payer: Medicare Other

## 2015-03-16 ENCOUNTER — Encounter (HOSPITAL_COMMUNITY): Payer: Self-pay | Admitting: *Deleted

## 2015-03-16 DIAGNOSIS — Z79899 Other long term (current) drug therapy: Secondary | ICD-10-CM | POA: Insufficient documentation

## 2015-03-16 DIAGNOSIS — R0602 Shortness of breath: Secondary | ICD-10-CM | POA: Diagnosis present

## 2015-03-16 DIAGNOSIS — Z8619 Personal history of other infectious and parasitic diseases: Secondary | ICD-10-CM | POA: Insufficient documentation

## 2015-03-16 DIAGNOSIS — R0600 Dyspnea, unspecified: Secondary | ICD-10-CM

## 2015-03-16 DIAGNOSIS — M79604 Pain in right leg: Secondary | ICD-10-CM

## 2015-03-16 DIAGNOSIS — M79661 Pain in right lower leg: Secondary | ICD-10-CM | POA: Insufficient documentation

## 2015-03-16 DIAGNOSIS — W19XXXA Unspecified fall, initial encounter: Secondary | ICD-10-CM

## 2015-03-16 DIAGNOSIS — M25551 Pain in right hip: Secondary | ICD-10-CM

## 2015-03-16 DIAGNOSIS — F329 Major depressive disorder, single episode, unspecified: Secondary | ICD-10-CM | POA: Insufficient documentation

## 2015-03-16 DIAGNOSIS — R011 Cardiac murmur, unspecified: Secondary | ICD-10-CM | POA: Insufficient documentation

## 2015-03-16 DIAGNOSIS — K219 Gastro-esophageal reflux disease without esophagitis: Secondary | ICD-10-CM | POA: Insufficient documentation

## 2015-03-16 DIAGNOSIS — E785 Hyperlipidemia, unspecified: Secondary | ICD-10-CM | POA: Diagnosis not present

## 2015-03-16 LAB — CBC
HCT: 37.9 % (ref 36.0–46.0)
Hemoglobin: 12.1 g/dL (ref 12.0–15.0)
MCH: 30.3 pg (ref 26.0–34.0)
MCHC: 31.9 g/dL (ref 30.0–36.0)
MCV: 95 fL (ref 78.0–100.0)
Platelets: 277 10*3/uL (ref 150–400)
RBC: 3.99 MIL/uL (ref 3.87–5.11)
RDW: 14.3 % (ref 11.5–15.5)
WBC: 7.1 10*3/uL (ref 4.0–10.5)

## 2015-03-16 LAB — BASIC METABOLIC PANEL
Anion gap: 13 (ref 5–15)
BUN: 23 mg/dL — ABNORMAL HIGH (ref 6–20)
CO2: 25 mmol/L (ref 22–32)
Calcium: 9.5 mg/dL (ref 8.9–10.3)
Chloride: 103 mmol/L (ref 101–111)
Creatinine, Ser: 1.28 mg/dL — ABNORMAL HIGH (ref 0.44–1.00)
GFR calc Af Amer: 48 mL/min — ABNORMAL LOW (ref >60)
GFR calc non Af Amer: 41 mL/min — ABNORMAL LOW (ref >60)
Glucose, Bld: 160 mg/dL — ABNORMAL HIGH (ref 65–99)
Potassium: 3.7 mmol/L (ref 3.5–5.1)
Sodium: 141 mmol/L (ref 135–145)

## 2015-03-16 LAB — I-STAT TROPONIN, ED: Troponin i, poc: 0 ng/mL (ref 0.00–0.08)

## 2015-03-16 LAB — D-DIMER, QUANTITATIVE: D-Dimer, Quant: 0.36 ug/mL-FEU (ref 0.00–0.50)

## 2015-03-16 NOTE — ED Provider Notes (Signed)
CSN: [id number]     Arrival date & time 03/16/15  1115 History   First MD Initiated Contact with Patient 03/16/15 1328     Chief Complaint  Patient presents with  . Leg Pain  . Shortness of Breath     (Consider location/radiation/quality/duration/timing/severity/associated sxs/prior Treatment) HPI This is a 70 year old female with a history of depression, GERD, hyperlipidemia who presents today stating that she has had some right leg pain for 3 weeks since she was started on Abilify. She states she told the doctor that Abilify was not the right medicine for her. She states she took it and then began having pain in the lateral upper aspect of her right leg. She states that she also has had shortness of breath for a extended period of time of which she is unable to quantify. She states that she has been having some cough productive of some whitish sputum. She is not sure whether she has has had a fever or not. The shortness of breath is not worse than baseline. He has not noted any increased swelling in her legs. She denies any chest pain. Her primary care is in [place] and she has not seen them recently. Past Medical History  Diagnosis Date  . Depression   . GERD (gastroesophageal reflux disease)   . Neuromuscular disorder ([place]) 2007    hiatal hernia  . Heart murmur   . Hyperlipidemia   . Mitral valve disorder 2007    "mitral valve leakage"  . Diverticulosis   . History of chicken pox   . Mini stroke ([place]) 01/14/08  . Orthostatic hypotension   . Achilles tendinitis   . Low back pain    Past Surgical History  Procedure Laterality Date  . Cholecystectomy  06/06/83  . Tonsillectomy      70 years old   Family History  Problem Relation Age of Onset  . Alcohol abuse Mother   . Heart disease Mother   . Stroke Mother   . Hypertension Mother   . Depression Mother   . Heart disease Father   . Coronary artery disease Father   . Depression Sister   . Hypertension Sister   . Alcohol  abuse Sister   . Cancer Maternal Aunt     breast  . Hypertension Maternal Aunt   . Cancer Cousin     breast   Social History  Substance Use Topics  . Smoking status: Never Smoker   . Smokeless tobacco: Never Used  . Alcohol Use: No     Comment: once or twice a year   OB History    No data available     Review of Systems  All other systems reviewed and are negative.     Allergies  Tegretol  Home Medications   Prior to Admission medications   Medication Sig Start Date End Date Taking? Authorizing Provider  allopurinol (ZYLOPRIM) 300 MG tablet Take 300 mg by mouth daily.   Yes Historical Provider, MD  Cholecalciferol (VITAMIN D3) 3000 UNITS TABS Take by mouth daily.    Yes Historical Provider, MD  cyanocobalamin ([employer]S VITAMIN B12) 1000 MCG tablet Take 500 mcg by mouth daily. Take two tabs daily   Yes Historical Provider, MD  dexlansoprazole (DEXILANT) 60 MG capsule Take 60 mg by mouth daily.   Yes Historical Provider, MD  escitalopram (LEXAPRO) 20 MG tablet Take 30 mg by mouth daily. 12/13/12  Yes Garvin Fila, MD  SYNTHROID 50 MCG tablet Take 1 tablet by mouth  daily. 03/11/15  Yes Historical Provider, MD  rosuvastatin (CRESTOR) 10 MG tablet Take 10 mg by mouth daily.    Historical Provider, MD   BP 121/63 mmHg  Pulse 68  Temp(Src) 97.6 F (36.4 C) (Oral)  Resp 20  SpO2 98% Physical Exam  Constitutional: She is oriented to person, place, and time. She appears well-developed and well-nourished.  HENT:  Head: Normocephalic and atraumatic.  Right Ear: External ear normal.  Left Ear: External ear normal.  Nose: Nose normal.  Mouth/Throat: Oropharynx is clear and moist.  Eyes: Conjunctivae and EOM are normal. Pupils are equal, round, and reactive to light.  Neck: Normal range of motion. Neck supple.  Cardiovascular: Normal rate, regular rhythm, normal heart sounds and intact distal pulses.   Pulmonary/Chest: Effort normal and breath sounds normal.  Abdominal: Soft.  Bowel sounds are normal.  Musculoskeletal: Normal range of motion. She exhibits tenderness.       Legs: Neurological: She is alert and oriented to person, place, and time. She has normal reflexes.  Skin: Skin is warm and dry.  Psychiatric: She has a normal mood and affect. Her behavior is normal. Judgment and thought content normal.  Nursing note and vitals reviewed.   ED Course  Procedures (including critical care time) Labs Review Labs Reviewed  BASIC METABOLIC PANEL - Abnormal; Notable for the following:    Glucose, Bld 160 (*)    BUN 23 (*)    Creatinine, Ser 1.28 (*)    GFR calc non Af Amer 41 (*)    GFR calc Af Amer 48 (*)    All other components within normal limits  CBC  D-DIMER, QUANTITATIVE (NOT AT [facility])  Randolm Idol, ED    Imaging Review Dg Chest 2 View  03/16/2015  CLINICAL DATA:  Shortness of breath with productive cough. EXAM: CHEST - 2 VIEW COMPARISON:  Two-view chest x-Mylan Lengyel 04/12/2011. FINDINGS: A very large hiatal hernia has significantly increased in size since the prior exam. It appears that much of the stomach is now above the diaphragm. Lung volumes are low. Minimal bibasilar atelectasis is present. There is no significant edema or effusion. Degenerative changes are present in the upper thoracic spine. Surgical clips are present at the gallbladder fossa. IMPRESSION: 1. Significant increase in large hiatal hernia with much of the stomach now above the diaphragm. 2. Low lung volumes and minimal bibasilar atelectasis. Electronically Signed   By: San Morelle M.D.   On: 03/16/2015 11:50   Dg Hip Unilat With Pelvis 2-3 Views Right  03/16/2015  CLINICAL DATA:  RIGHT hip pain since November after starting a new anti psychotic medication, worsening pain, no injury EXAM: DG HIP (WITH OR WITHOUT PELVIS) 2-3V RIGHT COMPARISON:  None FINDINGS: Diffuse osseous demineralization. Hip and SI joint spaces preserved. No acute fracture or dislocation. Mild degenerative disc  and facet disease changes of the visualized lower lumbar spine. IMPRESSION: Osseous demineralization without acute bony abnormalities. Degenerative disc and facet disease changes at the visualized lower lumbar spine. Electronically Signed   By: Lavonia Dana M.D.   On: 03/16/2015 14:59   I have personally reviewed and evaluated these images and lab results as part of my medical decision-making.   EKG Interpretation None      MDM   Final diagnoses:  Fall  Hip pain, right    70 year old female with chronic dyspnea with normal saturations and no change in her chest x-[place]. She has a known large hiatal hernia. She also had some tenderness in  her leg. She has no known injury. D-dimer is negative and x-Tranice Laduke is normal of the right hip. Patient appears clinically stable for discharge. She is advised regarding  return precautions and need for follow-up post understanding    Pattricia Boss, MD 03/21/15 1650

## 2015-03-16 NOTE — Discharge Instructions (Signed)

## 2015-03-16 NOTE — ED Notes (Signed)
Patient transported to X-ray 

## 2015-03-16 NOTE — ED Notes (Signed)
Pt reports right leg pain x 3 weeks unsure if it is swollen. Also reports sob for extended amount of time with a productive cough.

## 2015-03-17 ENCOUNTER — Telehealth: Payer: Self-pay | Admitting: *Deleted

## 2015-03-17 NOTE — Telephone Encounter (Signed)
Pt called stating she needed a PCP because she has a bed awaiting and they need the name of her PCP.  EDCM was on conference call and told pt she would return call.  EDCM returned call, but pt did not answer and voicemail has not been set up to leave a message.  EDCM will await return call from pt.

## 2015-07-30 DIAGNOSIS — Q278 Other specified congenital malformations of peripheral vascular system: Secondary | ICD-10-CM | POA: Insufficient documentation

## 2015-10-09 DIAGNOSIS — G4730 Sleep apnea, unspecified: Secondary | ICD-10-CM | POA: Insufficient documentation

## 2015-10-30 ENCOUNTER — Emergency Department (HOSPITAL_COMMUNITY)
Admission: EM | Admit: 2015-10-30 | Discharge: 2015-10-30 | Disposition: A | Discharge status: Home / Self Care | Payer: Medicare Other | Attending: Emergency Medicine | Admitting: Emergency Medicine

## 2015-10-30 ENCOUNTER — Encounter (HOSPITAL_COMMUNITY): Payer: Self-pay | Admitting: Emergency Medicine

## 2015-10-30 ENCOUNTER — Emergency Department (HOSPITAL_COMMUNITY): Payer: Medicare Other

## 2015-10-30 DIAGNOSIS — R1032 Left lower quadrant pain: Secondary | ICD-10-CM | POA: Diagnosis not present

## 2015-10-30 DIAGNOSIS — Z8673 Personal history of transient ischemic attack (TIA), and cerebral infarction without residual deficits: Secondary | ICD-10-CM | POA: Diagnosis not present

## 2015-10-30 DIAGNOSIS — G8918 Other acute postprocedural pain: Secondary | ICD-10-CM | POA: Diagnosis not present

## 2015-10-30 DIAGNOSIS — R109 Unspecified abdominal pain: Secondary | ICD-10-CM | POA: Diagnosis present

## 2015-10-30 DIAGNOSIS — Z79899 Other long term (current) drug therapy: Secondary | ICD-10-CM | POA: Insufficient documentation

## 2015-10-30 LAB — COMPREHENSIVE METABOLIC PANEL
ALT: 16 U/L (ref 14–54)
AST: 23 U/L (ref 15–41)
Albumin: 3.5 g/dL (ref 3.5–5.0)
Alkaline Phosphatase: 81 U/L (ref 38–126)
Anion gap: 11 (ref 5–15)
BUN: 16 mg/dL (ref 6–20)
CO2: 24 mmol/L (ref 22–32)
Calcium: 9.6 mg/dL (ref 8.9–10.3)
Chloride: 101 mmol/L (ref 101–111)
Creatinine, Ser: 1.02 mg/dL — ABNORMAL HIGH (ref 0.44–1.00)
GFR calc Af Amer: >60 mL/min (ref >60)
GFR calc non Af Amer: 54 mL/min — ABNORMAL LOW (ref >60)
Glucose, Bld: 94 mg/dL (ref 65–99)
Potassium: 3.8 mmol/L (ref 3.5–5.1)
Sodium: 136 mmol/L (ref 135–145)
Total Bilirubin: 0.9 mg/dL (ref 0.3–1.2)
Total Protein: 7.2 g/dL (ref 6.5–8.1)

## 2015-10-30 LAB — CBC WITH DIFFERENTIAL/PLATELET
Basophils Absolute: 0 10*3/uL (ref 0.0–0.1)
Basophils Relative: 0 %
Eosinophils Absolute: 0.4 10*3/uL (ref 0.0–0.7)
Eosinophils Relative: 5 %
HCT: 38 % (ref 36.0–46.0)
Hemoglobin: 12.3 g/dL (ref 12.0–15.0)
Lymphocytes Relative: 35 %
Lymphs Abs: 2.9 10*3/uL (ref 0.7–4.0)
MCH: 30.6 pg (ref 26.0–34.0)
MCHC: 32.4 g/dL (ref 30.0–36.0)
MCV: 94.5 fL (ref 78.0–100.0)
Monocytes Absolute: 0.5 10*3/uL (ref 0.1–1.0)
Monocytes Relative: 6 %
Neutro Abs: 4.4 10*3/uL (ref 1.7–7.7)
Neutrophils Relative %: 54 %
Platelets: 441 10*3/uL — ABNORMAL HIGH (ref 150–400)
RBC: 4.02 MIL/uL (ref 3.87–5.11)
RDW: 15.3 % (ref 11.5–15.5)
WBC: 8.1 10*3/uL (ref 4.0–10.5)

## 2015-10-30 LAB — URINALYSIS, ROUTINE W REFLEX MICROSCOPIC
Bilirubin Urine: NEGATIVE
Glucose, UA: NEGATIVE mg/dL
Hgb urine dipstick: NEGATIVE
Ketones, ur: NEGATIVE mg/dL
Leukocytes, UA: NEGATIVE
Nitrite: NEGATIVE
Protein, ur: NEGATIVE mg/dL
Specific Gravity, Urine: 1.016 (ref 1.005–1.030)
pH: 5.5 (ref 5.0–8.0)

## 2015-10-30 LAB — LIPASE, BLOOD: Lipase: 20 U/L (ref 11–51)

## 2015-10-30 MED ORDER — POLYETHYLENE GLYCOL 3350 17 G PO PACK: 17.0000 g | PACK | Freq: Every day | ORAL | 0 refills | Status: DC

## 2015-10-30 MED ORDER — IOPAMIDOL (ISOVUE-300) INJECTION 61%
INTRAVENOUS | Status: AC
  Filled 2015-10-30: qty 100

## 2015-10-30 MED ORDER — ACETAMINOPHEN 325 MG PO TABS: 325.0000 mg | ORAL_TABLET | Freq: Once | ORAL | Status: DC

## 2015-10-30 MED ORDER — DOCUSATE SODIUM 100 MG PO CAPS: 100.0000 mg | ORAL_CAPSULE | Freq: Two times a day (BID) | ORAL | 0 refills | Status: DC

## 2015-10-30 MED ORDER — DICYCLOMINE HCL 20 MG PO TABS: 20.0000 mg | ORAL_TABLET | Freq: Two times a day (BID) | ORAL | 0 refills | Status: DC | PRN

## 2015-10-30 MED ORDER — ACETAMINOPHEN 325 MG PO TABS
650.0000 mg | ORAL_TABLET | Freq: Once | ORAL | Status: AC
  Administered 2015-10-30: 650 mg via ORAL
  Filled 2015-10-30: qty 2

## 2015-10-30 NOTE — ED Notes (Signed)
Called [facility] to confirm pt on list.  Sts patient is not listed on list from earlier today.  Added patient to list.

## 2015-10-30 NOTE — ED Provider Notes (Signed)
[facility] DEPT Provider Note   CSN: [id number] Arrival date & time: 10/30/15  [id number]     History   Chief Complaint Chief Complaint  Patient presents with  . Abdominal Pain    post op problem    HPI Joy Jimenez is a 70 y.o. female.  HPI Patient underwent elective surgical repair of her hiatal hernia at [facility] by Dr. Radene Knee on 10/14/15. She was discharged to a SNFfor PT/OT. States that she's had consistent pain since the surgery and that physical therapy is exacerbating the pain. She is only taking Tylenol for the pain. She admits to passing hard stools but denies any nausea or vomiting. No fever or chills. She is requesting morphine. She arrives by EMS from nursing home facility. Past Medical History:  Diagnosis Date  . Achilles tendinitis   . Depression   . Diverticulosis   . GERD (gastroesophageal reflux disease)   . Heart murmur   . History of chicken pox   . Hyperlipidemia   . Low back pain   . Mini stroke ([place]) 01/14/08  . Mitral valve disorder 2007   "mitral valve leakage"  . Neuromuscular disorder ([place]) 2007   hiatal hernia  . Orthostatic hypotension     Patient Active Problem List   Diagnosis Date Noted  . Tension headache 12/13/2012  . Dizziness and giddiness 12/13/2012  . H/O TIA (transient ischemic attack) and stroke 12/13/2012  . Pain in joint, lower leg 12/13/2012  . Numbness in both legs 12/13/2012  . Orthostatic hypotension 04/12/2011  . Dyspnea on exertion 04/12/2011  . Depression 04/12/2011  . Hyperlipidemia 04/12/2011  . GERD (gastroesophageal reflux disease) 04/12/2011  . DYSPHAGIA UNSPECIFIED 05/28/2007    Past Surgical History:  Procedure Laterality Date  . CHOLECYSTECTOMY  06/06/83  . TONSILLECTOMY     70 years old    OB History    No data available       Home Medications    Prior to Admission medications   Medication Sig Start Date End Date Taking? Authorizing Provider  allopurinol (ZYLOPRIM) 300 MG tablet Take 300 mg by  mouth daily.   Yes Historical Provider, MD  Ascorbic Acid (VITAMIN C PO) Take 1 tablet by mouth daily.   Yes Historical Provider, MD  Cholecalciferol (VITAMIN D3) 3000 UNITS TABS Take by mouth daily.    Yes Historical Provider, MD  cyanocobalamin ([employer]S VITAMIN B12) 1000 MCG tablet Take 500 mcg by mouth daily. Take two tabs daily   Yes Historical Provider, MD  dexlansoprazole (DEXILANT) 60 MG capsule Take 60 mg by mouth daily.   Yes Historical Provider, MD  escitalopram (LEXAPRO) 20 MG tablet Take 20 mg by mouth daily.  12/13/12  Yes Garvin Fila, MD  rosuvastatin (CRESTOR) 10 MG tablet Take 10 mg by mouth daily.   Yes Historical Provider, MD  SYNTHROID 50 MCG tablet Take 1 tablet by mouth daily. 03/11/15  Yes Historical Provider, MD  dicyclomine (BENTYL) 20 MG tablet Take 1 tablet (20 mg total) by mouth 2 (two) times daily as needed for spasms. 10/30/15   Julianne Rice, MD  docusate sodium (COLACE) 100 MG capsule Take 1 capsule (100 mg total) by mouth every 12 (twelve) hours. 10/30/15   Julianne Rice, MD  polyethylene glycol [facility] Floria Raveling) packet Take 17 g by mouth daily. 10/30/15   Julianne Rice, MD    Family History Family History  Problem Relation Age of Onset  . Alcohol abuse Mother   . Heart disease Mother   .  Stroke Mother   . Hypertension Mother   . Depression Mother   . Heart disease Father   . Coronary artery disease Father   . Depression Sister   . Hypertension Sister   . Alcohol abuse Sister   . Cancer Maternal Aunt     breast  . Hypertension Maternal Aunt   . Cancer Cousin     breast    Social History Social History  Substance Use Topics  . Smoking status: Never Smoker  . Smokeless tobacco: Never Used  . Alcohol use No     Comment: once or twice a year     Allergies   Risperdal [risperidone] and Tegretol [carbamazepine]   Review of Systems Review of Systems  Constitutional: Negative for chills and fever.  Cardiovascular: Negative for chest pain.    Gastrointestinal: Positive for abdominal pain and constipation. Negative for abdominal distention, blood in stool, diarrhea, nausea and vomiting.  Genitourinary: Negative for dysuria and flank pain.  Neurological: Negative for dizziness, weakness, light-headedness, numbness and headaches.  All other systems reviewed and are negative.    Physical Exam Updated Vital Signs BP 104/72   Pulse 67   Temp 98.2 F (36.8 C) (Oral)   Resp 18   Ht 5\' 3"  (1.6 m)   Wt 219 lb (99.3 kg)   SpO2 98%   BMI 38.79 kg/m   Physical Exam   ED Treatments / Results  Labs (all labs ordered are listed, but only abnormal results are displayed) Labs Reviewed  CBC WITH DIFFERENTIAL/PLATELET - Abnormal; Notable for the following:       Result Value   Platelets 441 (*)    All other components within normal limits  COMPREHENSIVE METABOLIC PANEL - Abnormal; Notable for the following:    Creatinine, Ser 1.02 (*)    GFR calc non Af Amer 54 (*)    All other components within normal limits  URINALYSIS, ROUTINE W REFLEX MICROSCOPIC (NOT AT [facility])  LIPASE, BLOOD    EKG  EKG Interpretation None       Radiology Ct Abdomen Pelvis W Contrast  Result Date: 10/30/2015 CLINICAL DATA:  Increased epigastric pain for the past 2 days. Status post hiatal hernia repair on August 9th at outside facility. EXAM: CT ABDOMEN AND PELVIS WITH CONTRAST TECHNIQUE: Multidetector CT imaging of the abdomen and pelvis was performed using the standard protocol following bolus administration of intravenous contrast. CONTRAST:  100 cc Isovue-300 COMPARISON:  None. FINDINGS: Lower chest:  Mild bibasilar atelectasis. Hepatobiliary: Status post cholecystectomy with associated bile duct ectasia. Liver appears normal. Pancreas: No mass, inflammatory changes, or other significant abnormality. Spleen: Probable small cyst within the anterior-medial spleen. Otherwise unremarkable. Adrenals/Urinary Tract: Adrenal glands appear normal. Kidneys  appear normal without mass, stone or hydronephrosis. No ureteral or bladder calculi identified. Bladder appears normal. Stomach/Bowel: Surgical changes about the gastroesophageal hiatus, compatible with the given history of recent hiatal hernia repair. Expected thickening of the walls of the upper stomach and lower esophagus, and expected small amount of fluid/ edema within the lower paraesophageal mediastinum. No abscess collection or extraluminal air identified in this area. Stomach otherwise normal in appearance. No large or small bowel dilatation. Scattered diverticulosis noted within the sigmoid colon without evidence of acute diverticulitis. Appendix is normal. Vascular/Lymphatic: Scattered atherosclerotic changes of the normal caliber abdominal aorta. No enlarged lymph nodes seen. Reproductive: No mass or other significant abnormality. Other: No free fluid or abscess collection within the abdomen or pelvis. No free intraperitoneal air. Musculoskeletal: Mild  degenerative change within the lumbar spine. No acute or suspicious osseous finding. Expected postsurgical changes within the supraumbilical anterior abdominal wall. IMPRESSION: 1. Postsurgical changes at the gastroesophageal junction, compatible with given history of recent hiatal hernia repair. Expected thickening of the walls of the upper stomach and lower esophagus, an expected small amount of fluid/edema within the lower paraesophageal mediastinum. No abscess collection or extraluminal air identified. 2. No other acute-appearing findings. No small or large bowel obstruction. No evidence of acute solid organ abnormality. 3. Colonic diverticulosis without evidence of acute diverticulitis. 4. Aortic atherosclerosis. Electronically Signed   By: Franki Cabot M.D.   On: 10/30/2015 10:13   Dg Abd Acute W/chest  Result Date: 10/30/2015 CLINICAL DATA:  Abdominal pain for a week following abdominal hernia repair. EXAM: DG ABDOMEN ACUTE W/ 1V CHEST  COMPARISON:  Limited correlation made with chest radiographs 03/16/2015 and lumbar spine radiographs 01/14/2008. FINDINGS: No significant residual hiatal hernia is seen. The heart size and mediastinal contours are otherwise stable. There is interval improved aeration of the lung bases which are clear. There is no pleural effusion or pneumothorax. The bowel gas pattern is nonobstructive. There is mildly increased stool in the colon. No evidence of free intraperitoneal air. Right-sided abdominal surgical clips are unchanged. There is probable retained contrast within a distal colonic diverticulum. Mild lumbar spondylosis noted. IMPRESSION: No acute findings are demonstrated status post hiatal hernia repair. Mildly increased colonic stool burden. Electronically Signed   By: Richardean Sale M.D.   On: 10/30/2015 08:33    Procedures Procedures (including critical care time)  Medications Ordered in ED Medications  iopamidol (ISOVUE-300) 61 % injection (not administered)     Initial Impression / Assessment and Plan / ED Course  I have reviewed the triage vital signs and the nursing notes.  Pertinent labs & imaging results that were available during my care of the patient were reviewed by me and considered in my medical decision making (see chart for details).  Clinical Course    Abdominal exam without any peritoneal signs. CT with expected postsurgical findings. Patient does have some stool throughout the colon. We'll start on bowel regimen give Bentyl for bowel spasms. She is advised to follow-up with her surgeon. Return precautions given.  Final Clinical Impressions(s) / ED Diagnoses   Final diagnoses:  Left lower quadrant pain    New Prescriptions New Prescriptions   DICYCLOMINE (BENTYL) 20 MG TABLET    Take 1 tablet (20 mg total) by mouth 2 (two) times daily as needed for spasms.   DOCUSATE SODIUM (COLACE) 100 MG CAPSULE    Take 1 capsule (100 mg total) by mouth every 12 (twelve) hours.     POLYETHYLENE GLYCOL (MIRALAX / GLYCOLAX) PACKET    Take 17 g by mouth daily.     Julianne Rice, MD 10/30/15 1147

## 2015-10-30 NOTE — Progress Notes (Signed)
Family Contact/POA: York Spaniel [phone]

## 2015-10-30 NOTE — NC FL2 (Signed)
[place] MEDICAID FL2 LEVEL OF CARE SCREENING TOOL     IDENTIFICATION  Patient Name: Joy Jimenez Birthdate: 1945/08/10 Sex: female Admission Date (Current Location): 10/30/2015  [facility] and [place] Number:  Herbalist and Address:  [facility], [place] [address] [place], [place] [address]      Provider Number: [id number]  Attending Physician Name and Address:  Julianne Rice, MD  Relative Name and Phone Number:  Letonia Nicodemus (daughter) [phone]    Current Level of Care: SNF Recommended Level of Care: [place] Prior Approval Number:    Date Approved/Denied:   PASRR Number: [id number]   Discharge Plan: SNF    Current Diagnoses: Patient Active Problem List   Diagnosis Date Noted  . Tension headache 12/13/2012  . Dizziness and giddiness 12/13/2012  . H/O TIA (transient ischemic attack) and stroke 12/13/2012  . Pain in joint, lower leg 12/13/2012  . Numbness in both legs 12/13/2012  . Orthostatic hypotension 04/12/2011  . Dyspnea on exertion 04/12/2011  . Depression 04/12/2011  . Hyperlipidemia 04/12/2011  . GERD (gastroesophageal reflux disease) 04/12/2011  . DYSPHAGIA UNSPECIFIED 05/28/2007    Orientation RESPIRATION BLADDER Height & Weight     Self, Time, Situation, Place  Normal Continent Weight: 219 lb (99.3 kg) Height:  5\' 3"  (160 cm)  BEHAVIORAL SYMPTOMS/MOOD NEUROLOGICAL BOWEL NUTRITION STATUS   (NONE)  (NONE) Continent Diet (Heart Healthy Carb Modified)  AMBULATORY STATUS COMMUNICATION OF NEEDS Skin   Limited Assist Verbally Normal                       Personal Care Assistance Level of Assistance  Bathing, Feeding, Dressing Bathing Assistance: Limited assistance Feeding assistance: Independent Dressing Assistance: Limited assistance     Functional Limitations Info  Sight, Hearing, Speech Sight Info: Adequate Hearing Info: Adequate Speech Info: Adequate    SPECIAL CARE  FACTORS FREQUENCY                       Contractures Contractures Info: Not present    Additional Factors Info  Allergies   Allergies Info: Risperdal; Tegretol           Current Medications (10/30/2015):  This is the current hospital active medication list Current Facility-Administered Medications  Medication Dose Route Frequency Provider Last Rate Last Dose  . iopamidol (ISOVUE-300) 61 % injection            Current Outpatient Prescriptions  Medication Sig Dispense Refill  . allopurinol (ZYLOPRIM) 300 MG tablet Take 300 mg by mouth daily.    . Ascorbic Acid (VITAMIN C PO) Take 1 tablet by mouth daily.    . Cholecalciferol (VITAMIN D3) 3000 UNITS TABS Take by mouth daily.     . cyanocobalamin ([employer]S VITAMIN B12) 1000 MCG tablet Take 500 mcg by mouth daily. Take two tabs daily    . dexlansoprazole (DEXILANT) 60 MG capsule Take 60 mg by mouth daily.    Marland Kitchen escitalopram (LEXAPRO) 20 MG tablet Take 20 mg by mouth daily.     . rosuvastatin (CRESTOR) 10 MG tablet Take 10 mg by mouth daily.    Marland Kitchen SYNTHROID 50 MCG tablet Take 1 tablet by mouth daily.  1  . dicyclomine (BENTYL) 20 MG tablet Take 1 tablet (20 mg total) by mouth 2 (two) times daily as needed for spasms. 20 tablet 0  . docusate sodium (COLACE) 100 MG capsule Take 1 capsule (100 mg total) by  mouth every 12 (twelve) hours. 60 capsule 0  . polyethylene glycol (MIRALAX / GLYCOLAX) packet Take 17 g by mouth daily. 14 each 0     Discharge Medications: Please see discharge summary for a list of discharge medications.  Relevant Imaging Results:  Relevant Lab Results:   Additional Information SS # [id number]  Judeth Horn, LCSW

## 2015-10-30 NOTE — Progress Notes (Signed)
CSW engaged with Patient at her bedside. Patient reports that she has been at [facility] since 10/20/15 after having surgery at [facility]. Patient requesting to go to another facility due to feeling like [facility] "won't give me a stronger pain medication and is only giving me Tylenol". CSW explained that the facilities cannot administer any other medications unless they have a prescription from a physician. Patient also reports that "The physical therapy was pushing me to fast to work out and I'm not ready". Patient currently still has Medicare SNF days remaining. CSW has obtained Patient's PASRR, completed FL2, and faxed Patient out to other local facilities. CSW explained to Patient that if she has not received any bed offers by 2PM, she will have to return to [facility] and work with their discharge planner to obtain a new SNF.          Emiliano Dyer, LCSW [facility] ED/71M Clinical Social Worker [phone]

## 2015-10-30 NOTE — ED Notes (Signed)
Assisted patient to use bedpan. Patient expresses not further needs at this time.

## 2015-10-30 NOTE — ED Notes (Signed)
Pt.is requesting to talk with socialworker to day

## 2015-10-30 NOTE — ED Notes (Signed)
Pt able to roll side to side to be removed off bedpan and to be cleaned.  Pt groans with pain with movement, states "I deserve a dose of morphine for the pain, that's what they gave me in the hospital".

## 2015-10-30 NOTE — Progress Notes (Addendum)
Patient will DC to: [facility] SNF Anticipated DC date: 10/30/2015 Family notified: Sherin Quarry (sister) [phone] Transport by: Corey Harold  Nurse to Nurse Report: [phone]  Medical Necessity Form completed and given to RN. Patient can arrive between 4-5PM today.   CSW signing off. Please contact if new need(s) arise.   Holly Bodily, MSW, LCSW [facility] ED/61M Clinical Social Worker [phone]

## 2015-10-30 NOTE — ED Notes (Signed)
Report called to Amy, RN; pt to be transported to Pod C to await transportation to SNF.

## 2015-10-30 NOTE — ED Notes (Signed)
Patient has called out on the call bell x 5 times in the last 10 minutes with questions.   Patient states having increased pain and wants pain meds. Will call MD to request.

## 2015-10-30 NOTE — ED Triage Notes (Signed)
Pt brought to ED by [facility] from [facility] for 10/10 abd pain, pt recently had a hernia repair at [facility]  By Dr. Diona Foley, is on PT at SNF states PT make her get on a bike and pain started increasing getting worse today. States SNF just wants to give Tylenol for pain and she needs something stronger, VS for EMS 152/90, r-14, HR 70, SPO2 98% on RA. Incision clean and dry.

## 2015-10-30 NOTE — ED Notes (Signed)
Social Worker contacted per pt request, pt does not want to return to [facility] and is requesting [facility].  Caryl Pina CSW reports she will speak to pt about options.

## 2015-10-30 NOTE — ED Notes (Signed)
Full report called to [facility]. Receiving RN denies receiving paperwork from [name]. States she will call admission coordinator to clarify and call us back if any further issues.

## 2015-11-04 MED ORDER — IOPAMIDOL (ISOVUE-300) INJECTION 61%
100.0000 mL | Freq: Once | INTRAVENOUS | Status: AC | PRN
  Administered 2015-10-30: 100 mL via INTRAVENOUS

## 2016-06-01 ENCOUNTER — Encounter: Payer: Self-pay | Admitting: Internal Medicine

## 2016-06-01 ENCOUNTER — Ambulatory Visit (INDEPENDENT_AMBULATORY_CARE_PROVIDER_SITE_OTHER): Payer: Medicare Other | Admitting: Internal Medicine

## 2016-06-01 VITALS — BP 110/72 | HR 86 | Temp 97.9°F | Ht 63.0 in | Wt 202.0 lb

## 2016-06-01 DIAGNOSIS — M1990 Unspecified osteoarthritis, unspecified site: Secondary | ICD-10-CM

## 2016-06-01 DIAGNOSIS — E039 Hypothyroidism, unspecified: Secondary | ICD-10-CM

## 2016-06-01 DIAGNOSIS — Z8739 Personal history of other diseases of the musculoskeletal system and connective tissue: Secondary | ICD-10-CM | POA: Insufficient documentation

## 2016-06-01 DIAGNOSIS — G4733 Obstructive sleep apnea (adult) (pediatric): Secondary | ICD-10-CM

## 2016-06-01 DIAGNOSIS — K449 Diaphragmatic hernia without obstruction or gangrene: Secondary | ICD-10-CM

## 2016-06-01 DIAGNOSIS — E538 Deficiency of other specified B group vitamins: Secondary | ICD-10-CM

## 2016-06-01 DIAGNOSIS — K219 Gastro-esophageal reflux disease without esophagitis: Secondary | ICD-10-CM

## 2016-06-01 NOTE — Progress Notes (Signed)
   [name] Joy Mo, MD Phone: [phone]  Reason For Visit: New Patient   # Hypothyroidism  - patient takes synthroid is pretty well controlled  - Followed by primary care physician for this issue -  Indicates having a thyroid nodules diagnosis some years back, no hx of malignancy  -Unsure if patient has fine needle aspiration of nodule, nothing noted in her chart   Hx of gout  - per patient she has never had a gout episode   B-12 and Vitamin D deficency  - Would like a paper stating that she needs B12 and vitamin D supplements  Dysphagia /GERD  - Has significant hx of GERD  - Takes dexilant -  - Had a hiatal hernia repair and was hoping she would improve, however patient still with significant GERD symptoms  - Follows with GI, saw them recently   Past Medical History Reviewed problem list.  Medications- reviewed and updated No additions to family history Social history- patient is a non smoker  Objective: BP 110/72   Pulse 86   Temp 97.9 F (36.6 C) (Oral)   Ht 5\' 3"  (1.6 m)   Wt 202 lb (91.6 kg)   SpO2 96%   BMI 35.78 kg/m  Gen: NAD, alert, cooperative with exam Cardio: regular rate and rhythm, S1S2 heard, no murmurs appreciated Pulm: clear to auscultation bilaterally, no wheezes, rhonchi or rales GI: soft, non-tender, non-distended, bowel sounds present, n Extremities: warm, well perfused, No edema, cyanosis or clubbing;  MSK: Normal gait and station Skin: dry, intact, no rashes or lesions Neuro: Strength and sensation grossly intact   Assessment/Plan: See problem based a/p  Hypothyroidism Hx of thyroid nodule noted in 2014 - note sure FNA was performed. Patient unable to give much detail  - Will discuss again with patient  - Obtain TSH  - Continue synthroid - Follow up in 1 month   Hiatal hernia with gastroesophageal reflux GERD symptoms controlled with PPI despite hiatal hernia repair  - Following with GI  Hx of  gout Denies hx of gout attack, unsure of why patient is on allopurinol. Will obtain records.  If nothing noted in records, will stop allopurinol - uric acid level 3.4  B12 deficiency Will check for B12 deficiency  - Determine if I need to continue letter to [facility] for vitamin supplementation

## 2016-06-01 NOTE — Patient Instructions (Addendum)
I want you to follow-up in about a month to discuss your depression. Today I will get some labs to determine where several of your values are at. It was nice getting to know you.

## 2016-06-02 LAB — CMP14+EGFR
ALT: 8 IU/L (ref 0–32)
AST: 20 IU/L (ref 0–40)
Albumin/Globulin Ratio: 1.5 (ref 1.2–2.2)
Albumin: 4.1 g/dL (ref 3.5–4.8)
Alkaline Phosphatase: 79 IU/L (ref 39–117)
BUN/Creatinine Ratio: 16 (ref 12–28)
BUN: 17 mg/dL (ref 8–27)
Bilirubin Total: 0.8 mg/dL (ref 0.0–1.2)
CO2: 21 mmol/L (ref 18–29)
Calcium: 9.4 mg/dL (ref 8.7–10.3)
Chloride: 102 mmol/L (ref 96–106)
Creatinine, Ser: 1.06 mg/dL — ABNORMAL HIGH (ref 0.57–1.00)
GFR calc Af Amer: 61 mL/min/{1.73_m2} (ref >59)
GFR calc non Af Amer: 53 mL/min/{1.73_m2} — ABNORMAL LOW (ref >59)
Globulin, Total: 2.7 g/dL (ref 1.5–4.5)
Glucose: 92 mg/dL (ref 65–99)
Potassium: 4.3 mmol/L (ref 3.5–5.2)
Sodium: 143 mmol/L (ref 134–144)
Total Protein: 6.8 g/dL (ref 6.0–8.5)

## 2016-06-02 LAB — CBC
Hematocrit: 39.6 % (ref 34.0–46.6)
Hemoglobin: 12.9 g/dL (ref 11.1–15.9)
MCH: 31.1 pg (ref 26.6–33.0)
MCHC: 32.6 g/dL (ref 31.5–35.7)
MCV: 95 fL (ref 79–97)
Platelets: 251 10*3/uL (ref 150–379)
RBC: 4.15 x10E6/uL (ref 3.77–5.28)
RDW: 15.5 % — ABNORMAL HIGH (ref 12.3–15.4)
WBC: 6.6 10*3/uL (ref 3.4–10.8)

## 2016-06-02 LAB — URIC ACID: Uric Acid: 3.5 mg/dL (ref 2.5–7.1)

## 2016-06-02 LAB — TSH: TSH: 1.16 u[IU]/mL (ref 0.450–4.500)

## 2016-06-02 LAB — VITAMIN B12: Vitamin B-12: 958 pg/mL (ref 232–1245)

## 2016-06-07 NOTE — Assessment & Plan Note (Signed)
Will check for B12 deficiency  - Determine if I need to continue letter to [facility] for vitamin supplementation

## 2016-06-07 NOTE — Assessment & Plan Note (Signed)
Denies hx of gout attack, unsure of why patient is on allopurinol. Will obtain records.  If nothing noted in records, will stop allopurinol - uric acid level 3.4

## 2016-06-07 NOTE — Assessment & Plan Note (Signed)
GERD symptoms controlled with PPI despite hiatal hernia repair  - Following with GI

## 2016-06-07 NOTE — Assessment & Plan Note (Signed)
Hx of thyroid nodule noted in 2014 - note sure FNA was performed. Patient unable to give much detail  - Will discuss again with patient  - Obtain TSH  - Continue synthroid - Follow up in 1 month

## 2016-06-09 ENCOUNTER — Encounter: Payer: Self-pay | Admitting: Internal Medicine

## 2016-07-01 ENCOUNTER — Encounter: Payer: Self-pay | Admitting: Internal Medicine

## 2016-07-01 ENCOUNTER — Encounter: Payer: Self-pay | Admitting: Psychology

## 2016-07-01 ENCOUNTER — Ambulatory Visit (INDEPENDENT_AMBULATORY_CARE_PROVIDER_SITE_OTHER): Payer: Medicare Other | Admitting: Internal Medicine

## 2016-07-01 DIAGNOSIS — Z1159 Encounter for screening for other viral diseases: Secondary | ICD-10-CM

## 2016-07-01 DIAGNOSIS — F329 Major depressive disorder, single episode, unspecified: Secondary | ICD-10-CM | POA: Diagnosis not present

## 2016-07-01 DIAGNOSIS — F32A Depression, unspecified: Secondary | ICD-10-CM
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
## 2016-07-01 NOTE — Progress Notes (Signed)
Dr. Emmaline Life requested a [facility].   Presenting Issue:  Significant mental health history.  Concerns today for paranoid thinking.  Report of symptoms:  Patient reports that she was doing well until recently when she found she could not move to [place] to be close to her grandson (around age 71).  Her daughter (his mother) won't speak to her because of her mental health issues.  She also reports two friends "dropped" her recently - one because the husband thinks she has schizophrenia.  The other because she felt "misled" by the patient regarding a visionary she was telling her about.    Duration of CURRENT symptoms:  I got the sense her current distress started a few months ago.  Age of onset of first mood disturbance:  Dr. Emmaline Life reported the patient has a family history dating back to the late 1970s with two hospitalizations.    Impact on function:  She is losing friends (although she would not say this is due to her mental health issues), hygiene may be an issue.    Psychiatric History - Diagnoses: Only one in our record is Depression - Hospitalizations: Dr. Emmaline Life noted two [facility] hospitalizations. - Pharmacotherapy: Lexapro currently.  Has been tried on other medications but didn't like the side effects.  More interested in therapy than medication presently.   - Outpatient therapy: Was getting therapy up until 2016.  Would like a Magazine features editor.  Has looked into [facility] Counsel[facility] but thinks they don't take Medicare.  Family history of psychiatric issues:  Did not assess.    Current and history of substance use:  Did not asess.  Medical conditions that might explain or contribute to symptoms:  Reveiwed problem list.    Assessment / Plan / Recommendations: Patient is mildly to moderately unkempt.  She maintains good eye contact and is cooperative and attentive.  Speech is normal in tone, rate and rhythm.  Did not report on mood (although I think she would say  depressed).  Affect is within normal limits.  Thought process is tangential.  No evidence of desire to harm self or others.  Does not appear to be responding to any internal stimuli.  Denies auditory hallucinations, denies receiving messages from the radio or television.   Judgment and insight are poor.    Discussed with Dr. Emmaline Life and I will likely discuss with Dr. McDiarmid (secondary to geriatric status of patient).  My thinking is:  1) Close-follow-up with PCP and Integrated Care at the [facility]; this way we will be able to note any changes in presentation or function  2)  Get her social needs assessed by Casimer Lanius.  Patient voiced interest in this.  She says her food stamps were recently cut and she can't pay some of her bills.  I asked if she had food and she responded yes.  Uses [employer] transportation in addition to relying on two sisters that live in [place].  I think a thorough assessment here might be useful.  3)  I will research Christian-based counseling agencies.  I am concerned that even with Medicare / Medicaid, the copay will be cost-prohibitive (given financial issues).    4)  If losses of relationships continue to be reported by patient, may use this as leverage to revisit medication.  She is currently not thinking she needs a change in medication.      Warmhandoff:    Warm Hand Off Completed.

## 2016-07-01 NOTE — Patient Instructions (Signed)
I think you are doing great. I want you to follow up with me in about 1 month.  As Dr. Gwenlyn Saran mentioned, I am going to set up with Casimer Lanius to see about what resources we can provide for you

## 2016-07-05 ENCOUNTER — Telehealth: Payer: Self-pay | Admitting: Licensed Clinical Social Worker

## 2016-07-05 NOTE — Assessment & Plan Note (Addendum)
Patient with delusions of grandeur. Overall depression seems to be well controlled. Unsure of patient's previous history of mental health issues. Discussed having her give a release of records which she agreed to do. Patient with obvious delusional thoughts however no schizophrenic tendencies very stables. suicidal or homicidal ideations - Does not want to currently change her medications as she is currently happy with the SSRI that she is on as it does not give her any side effects -She was referred to behavioral medicine and received counseling today -Discussed patient with Dr. McDiarmid and Dr. Gwenlyn Saran - consider following closely with patient - Will CC.Casimer Lanius as patient with some food insecurity

## 2016-07-05 NOTE — Progress Notes (Signed)
Social work consult from Dr. Gwenlyn Saran, patient could benefit from a social needs assessment, patient open to LCSW contacting her. Called patient to assess needs reference the above consult. Left message for patient to call LCSW. Plan:  If no return phone call is received, LCSW will followup with patient again in 5 to 7 business days.  Casimer Lanius, LCSW Licensed Clinical Social Worker [facility]   [phone] 2:13 PM

## 2016-07-08 ENCOUNTER — Telehealth: Payer: Self-pay | Admitting: Licensed Clinical Social Worker

## 2016-07-08 NOTE — Progress Notes (Signed)
MRN: [id number] Name: Joy Jimenez  F/U phne call to patient ref. social needs assessment.  SUBJECTIVE: Joy Jimenez is a 71 y.o. female referred by Dr. Gwenlyn Saran for a social needs assessment. 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  LIFE CONTEXT: Family and Social: lives with her 2 cats, has 1 dau in [place], and a sister that lives local.  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 Receives $ [place], Medicaid and [employer].  Self-Care: not assessed Life Changes: Foodstamps reduced from $194.00 per month to $94.00 per month.  GOALS ADDRESSED: Connect patient with community food resources.  INTERVENTIONS: Link to [employer]   ASSESSMENT: Patient currently experiencing concerns related to not having enough money to pay bills.  Money is not being used to purchase food due to reduction in foodstamps. LCSW reviewed community food program with patient.  Patient is in agreement to be referred to the program.  Application completed for Food Assistance on [employer] and faxed.  PLAN: 1. LCSW will F/u with patient on May 30th during her visit with PCP 2. Referral(s): Community Resources:  Food program   Casimer Lanius, LCSW Licensed Clinical Social Worker [place]   [phone] 4:40 PM

## 2016-07-29 ENCOUNTER — Telehealth: Payer: Self-pay | Admitting: Licensed Clinical Social Worker

## 2016-07-29 DIAGNOSIS — R7303 Prediabetes: Secondary | ICD-10-CM | POA: Diagnosis not present

## 2016-07-29 DIAGNOSIS — E782 Mixed hyperlipidemia: Secondary | ICD-10-CM | POA: Diagnosis not present

## 2016-07-29 DIAGNOSIS — R69 Illness, unspecified: Secondary | ICD-10-CM | POA: Diagnosis not present

## 2016-07-29 DIAGNOSIS — E039 Hypothyroidism, unspecified: Secondary | ICD-10-CM | POA: Diagnosis not present

## 2016-07-29 NOTE — Progress Notes (Signed)
F/U call to Opal Sidles with [employer]cer[facility] on [address].  States she is in the process of getting patient set up for the program.  Will contact patient with an update and should be able to take food to her today.  Opal Sidles will contact LCSW if she is unable to assist patient with the program. F/U call to patient to provide and update. Patient on her way out and unable to talk.    Plan:  LCSW will f/u with patient during office visit next week with PCP.   Casimer Lanius, LCSW Licensed Clinical Social Worker [facility]   [phone] 9:51 AM

## 2016-08-03 ENCOUNTER — Ambulatory Visit: Payer: Medicare Other | Admitting: Internal Medicine

## 2016-08-15 ENCOUNTER — Ambulatory Visit (INDEPENDENT_AMBULATORY_CARE_PROVIDER_SITE_OTHER): Payer: Medicare HMO | Admitting: Internal Medicine

## 2016-08-15 ENCOUNTER — Encounter: Payer: Self-pay | Admitting: Internal Medicine

## 2016-08-15 VITALS — BP 121/70 | HR 69 | Temp 97.5°F | Ht 63.0 in | Wt 201.2 lb

## 2016-08-15 DIAGNOSIS — M545 Low back pain, unspecified: Secondary | ICD-10-CM

## 2016-08-15 DIAGNOSIS — F329 Major depressive disorder, single episode, unspecified: Secondary | ICD-10-CM | POA: Diagnosis not present

## 2016-08-15 DIAGNOSIS — R69 Illness, unspecified: Secondary | ICD-10-CM | POA: Diagnosis not present

## 2016-08-15 DIAGNOSIS — Z0000 Encounter for general adult medical examination without abnormal findings: Secondary | ICD-10-CM | POA: Diagnosis not present

## 2016-08-15 DIAGNOSIS — E785 Hyperlipidemia, unspecified: Secondary | ICD-10-CM

## 2016-08-15 DIAGNOSIS — F32A Depression, unspecified: Secondary | ICD-10-CM

## 2016-08-15 MED ORDER — BACLOFEN 5 MG PO TABS: 5.0000 mg | ORAL_TABLET | Freq: Two times a day (BID) | ORAL | 0 refills | Status: DC | PRN

## 2016-08-15 NOTE — Patient Instructions (Addendum)
Please follow up with me 3 months . Please take baclofen as needed for your back pain. You can take Tylenol first. Followed by the baclofen. Please try doing some back exercises to help with the pain he can do some stretches as shown below. Please continue taking her Crestor and other medications such as Lexapro.  Back Exercises If you have pain in your back, do these exercises 2-3 times each day or as told by your doctor. When the pain goes away, do the exercises once each day, but repeat the steps more times for each exercise (do more repetitions). If you do not have pain in your back, do these exercises once each day or as told by your doctor. Exercises Single Knee to Chest  Do these steps 3-5 times in a row for each leg: 1. Lie on your back on a firm bed or the floor with your legs stretched out. 2. Bring one knee to your chest. 3. Hold your knee to your chest by grabbing your knee or thigh. 4. Pull on your knee until you feel a gentle stretch in your lower back. 5. Keep doing the stretch for 10-30 seconds. 6. Slowly let go of your leg and straighten it.  Pelvic Tilt  Do these steps 5-10 times in a row: 1. Lie on your back on a firm bed or the floor with your legs stretched out. 2. Bend your knees so they point up to the ceiling. Your feet should be flat on the floor. 3. Tighten your lower belly (abdomen) muscles to press your lower back against the floor. This will make your tailbone point up to the ceiling instead of pointing down to your feet or the floor. 4. Stay in this position for 5-10 seconds while you gently tighten your muscles and breathe evenly.  Cat-Cow  Do these steps until your lower back bends more easily: 1. Get on your hands and knees on a firm surface. Keep your hands under your shoulders, and keep your knees under your hips. You may put padding under your knees. 2. Let your head hang down, and make your tailbone point down to the floor so your lower back is round  like the back of a cat. 3. Stay in this position for 5 seconds. 4. Slowly lift your head and make your tailbone point up to the ceiling so your back hangs low (sags) like the back of a cow. 5. Stay in this position for 5 seconds.  Press-Ups  Do these steps 5-10 times in a row: 1. Lie on your belly (face-down) on the floor. 2. Place your hands near your head, about shoulder-width apart. 3. While you keep your back relaxed and keep your hips on the floor, slowly straighten your arms to raise the top half of your body and lift your shoulders. Do not use your back muscles. To make yourself more comfortable, you may change where you place your hands. 4. Stay in this position for 5 seconds. 5. Slowly return to lying flat on the floor.  Bridges  Do these steps 10 times in a row: 1. Lie on your back on a firm surface. 2. Bend your knees so they point up to the ceiling. Your feet should be flat on the floor. 3. Tighten your butt muscles and lift your butt off of the floor until your waist is almost as high as your knees. If you do not feel the muscles working in your butt and the back of your thighs, slide  your feet 1-2 inches farther away from your butt. 4. Stay in this position for 3-5 seconds. 5. Slowly lower your butt to the floor, and let your butt muscles relax.  If this exercise is too easy, try doing it with your arms crossed over your chest. Belly Crunches  Do these steps 5-10 times in a row: 1. Lie on your back on a firm bed or the floor with your legs stretched out. 2. Bend your knees so they point up to the ceiling. Your feet should be flat on the floor. 3. Cross your arms over your chest. 4. Tip your chin a little bit toward your chest but do not bend your neck. 5. Tighten your belly muscles and slowly raise your chest just enough to lift your shoulder blades a tiny bit off of the floor. 6. Slowly lower your chest and your head to the floor.  Back Lifts Do these steps 5-10  times in a row: 1. Lie on your belly (face-down) with your arms at your sides, and rest your forehead on the floor. 2. Tighten the muscles in your legs and your butt. 3. Slowly lift your chest off of the floor while you keep your hips on the floor. Keep the back of your head in line with the curve in your back. Look at the floor while you do this. 4. Stay in this position for 3-5 seconds. 5. Slowly lower your chest and your face to the floor.  Contact a doctor if:  Your back pain gets a lot worse when you do an exercise.  Your back pain does not lessen 2 hours after you exercise. If you have any of these problems, stop doing the exercises. Do not do them again unless your doctor says it is okay. Get help right away if:  You have sudden, very bad back pain. If this happens, stop doing the exercises. Do not do them again unless your doctor says it is okay. This information is not intended to replace advice given to you by your health care provider. Make sure you discuss any questions you have with your health care provider. Document Released: 03/26/2010 Document Revised: 07/30/2015 Document Reviewed: 04/17/2014 Elsevier Interactive Patient Education  [employer].
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
## 2016-08-16 ENCOUNTER — Telehealth: Payer: Self-pay | Admitting: Licensed Clinical Social Worker

## 2016-08-16 NOTE — Progress Notes (Signed)
F/U call to Opal Sidles with [employer] [address] [employer] [address].  The volunteer assigned to patient can no longer deliver food.  Patient remains on the wait list.    LCSW called patient to provide an update. Patient informed LCSW she recently had a hearing about her foodstamps, if they are increased she will not need the food program.Patient should know the outcome in about 2 weeks.   Plan:  LCSW will f/u with patient in 2 to 3 weeks  Casimer Lanius, LCSW Licensed Clinical Social Worker [place]   [phone] 11:05 AM

## 2016-08-17 DIAGNOSIS — M545 Low back pain, unspecified: Secondary | ICD-10-CM | POA: Insufficient documentation

## 2016-08-17 DIAGNOSIS — Z0000 Encounter for general adult medical examination without abnormal findings: Secondary | ICD-10-CM | POA: Insufficient documentation

## 2016-08-17 NOTE — Assessment & Plan Note (Signed)
Well controlled - continue crestor

## 2016-08-17 NOTE — Assessment & Plan Note (Addendum)
Significant delusions, often stories that don't make any sense. Overall function without issue. Depression/Anxiety well controlled PHQ-9 6/ GAD-7 5  - Continue Lexapro  - follow up in 3 months

## 2016-08-17 NOTE — Assessment & Plan Note (Signed)
Likely musculoskeletal, improving per patient. No red flags -continue tylenol as needed  - can use baclofen cautiously for breakthrough pain  - Follow up as needed.

## 2016-08-17 NOTE — Assessment & Plan Note (Signed)
Refuse PNA vaccination   Has received Dexa scan and mammogram - working on obtaining records of these  Needs Hep C, however will wait to next lab draw

## 2016-09-06 ENCOUNTER — Telehealth: Payer: Self-pay | Admitting: Licensed Clinical Social Worker

## 2016-09-06 NOTE — Progress Notes (Signed)
f/u call to mobile food program to see if patient remains on the wait list. Left message for Gean Quint to call LCSW.  Called patient for an updated on the status of her foodstamps and [employer] program.  Left message to call LCSW.   Plan: LCSW will wait for return calls.  Casimer Lanius, LCSW Licensed Clinical Social Worker [place]   [phone] 11:10 AM

## 2016-09-08 ENCOUNTER — Encounter: Payer: Self-pay | Admitting: Internal Medicine

## 2016-09-13 ENCOUNTER — Telehealth: Payer: Self-pay | Admitting: Licensed Clinical Social Worker

## 2016-09-13 NOTE — Progress Notes (Signed)
Return call from Opal Sidles with mobile food program.  She has a new volunteer for patient, anticipates food delivery around the 20th of this month.   Volunteer will call patient with an update for day and time of delivery.  Casimer Lanius, LCSW Licensed Clinical Social Worker [facility]   [phone] 11:03 AM

## 2016-09-15 ENCOUNTER — Ambulatory Visit: Payer: Medicare Other | Admitting: Endocrinology

## 2016-09-19 ENCOUNTER — Ambulatory Visit (INDEPENDENT_AMBULATORY_CARE_PROVIDER_SITE_OTHER): Payer: Medicare HMO | Admitting: Pulmonary Disease

## 2016-09-19 ENCOUNTER — Encounter: Payer: Self-pay | Admitting: Pulmonary Disease

## 2016-09-19 VITALS — BP 102/70 | HR 71 | Ht 63.0 in | Wt 204.0 lb

## 2016-09-19 DIAGNOSIS — G4733 Obstructive sleep apnea (adult) (pediatric): Secondary | ICD-10-CM | POA: Diagnosis not present

## 2016-09-19 NOTE — Patient Instructions (Signed)
Will arrange for in lab sleep study Will call to arrange for follow up after sleep study reviewed  

## 2016-09-19 NOTE — Progress Notes (Signed)
   Subjective:    Patient ID: Joy Jimenez, female    DOB: Mar 20, 1945, 71 y.o.   MRN: [id number]  HPI    Review of Systems  Constitutional: Negative for fever and unexpected weight change.  HENT: Negative for congestion, dental problem, ear pain, nosebleeds, postnasal drip, rhinorrhea, sinus pressure, sneezing, sore throat and trouble swallowing.   Eyes: Negative for redness and itching.  Respiratory: Positive for shortness of breath and wheezing ( with exertion). Negative for cough and chest tightness.   Cardiovascular: Positive for palpitations. Negative for leg swelling. Chest pain:  pain at times - followed by Cards.  Gastrointestinal: Negative for nausea and vomiting.       Acid reflux  Genitourinary: Negative for dysuria.  Musculoskeletal: Negative for joint swelling.  Skin: Negative for rash.  Neurological: Positive for headaches.  Hematological: Does not bruise/bleed easily.  Psychiatric/Behavioral: Positive for dysphoric mood. The patient is nervous/anxious.        Objective:   Physical Exam        Assessment & Plan:
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
## 2016-09-26 DIAGNOSIS — B372 Candidiasis of skin and nail: Secondary | ICD-10-CM | POA: Diagnosis not present

## 2016-09-26 DIAGNOSIS — N939 Abnormal uterine and vaginal bleeding, unspecified: Secondary | ICD-10-CM | POA: Diagnosis not present

## 2016-09-26 DIAGNOSIS — E039 Hypothyroidism, unspecified: Secondary | ICD-10-CM | POA: Diagnosis not present

## 2016-09-26 DIAGNOSIS — R7302 Impaired glucose tolerance (oral): Secondary | ICD-10-CM | POA: Diagnosis not present

## 2016-10-03 ENCOUNTER — Encounter: Payer: Self-pay | Admitting: Internal Medicine

## 2016-10-18 ENCOUNTER — Ambulatory Visit (HOSPITAL_BASED_OUTPATIENT_CLINIC_OR_DEPARTMENT_OTHER): Payer: Medicare HMO | Attending: Pulmonary Disease | Admitting: Pulmonary Disease

## 2016-10-18 VITALS — Ht 63.0 in | Wt 204.0 lb

## 2016-10-18 DIAGNOSIS — G4730 Sleep apnea, unspecified: Secondary | ICD-10-CM

## 2016-10-18 DIAGNOSIS — R0683 Snoring: Secondary | ICD-10-CM | POA: Diagnosis not present

## 2016-10-18 DIAGNOSIS — G4733 Obstructive sleep apnea (adult) (pediatric): Secondary | ICD-10-CM | POA: Diagnosis not present

## 2016-10-21 ENCOUNTER — Telehealth: Payer: Self-pay | Admitting: Pulmonary Disease

## 2016-10-21 ENCOUNTER — Telehealth: Payer: Self-pay | Admitting: Internal Medicine

## 2016-10-21 DIAGNOSIS — Z0000 Encounter for general adult medical examination without abnormal findings: Secondary | ICD-10-CM

## 2016-10-21 NOTE — Telephone Encounter (Signed)
ATC patient, split night was done 10/18/16, pt needs to be made aware that we will call next week to advise of results and follow up appt as Dr Halford Chessman has been out this week.  WCB

## 2016-10-21 NOTE — Telephone Encounter (Signed)
Would like a referral to gynecologist that is a femaile.  She has fibroids. Please advise

## 2016-10-24 NOTE — Telephone Encounter (Signed)
Referral has been placed for OBGYN has been placed per patient request

## 2016-10-24 NOTE — Telephone Encounter (Signed)
Pt is aware of below message and voiced her understanding. Nothing further needed.  

## 2016-10-31 ENCOUNTER — Telehealth: Payer: Self-pay | Admitting: *Deleted

## 2016-10-31 ENCOUNTER — Ambulatory Visit: Payer: Medicare HMO | Admitting: Endocrinology

## 2016-10-31 NOTE — Telephone Encounter (Signed)
Left voice message on nurse line requesting a call back regarding another Endocrinologist referral. The provider that she was referred to does not have appointment until November. Patient has another Endo provider that she can be seen in September, however she need a referral. Pt is also asking a referral for GYN female provider referral. Please give her a call at [phone].  Derl Barrow, RN

## 2016-11-01 ENCOUNTER — Other Ambulatory Visit: Payer: Self-pay | Admitting: Internal Medicine

## 2016-11-01 DIAGNOSIS — D259 Leiomyoma of uterus, unspecified: Secondary | ICD-10-CM

## 2016-11-01 NOTE — Telephone Encounter (Signed)
Called patient and let her know that she is scheduled for [facility] on 10/2. She states she will keep this appointment. Patient also requesting referral to OB/Gyn for uterine fibroids. Referral placed.

## 2016-11-01 NOTE — Telephone Encounter (Signed)
Looks like patient is scheduled at [facility] on 10/2.

## 2016-11-02 ENCOUNTER — Telehealth: Payer: Self-pay | Admitting: Pulmonary Disease

## 2016-11-02 DIAGNOSIS — G4733 Obstructive sleep apnea (adult) (pediatric): Secondary | ICD-10-CM

## 2016-11-02 NOTE — Telephone Encounter (Signed)
PSG 10/18/16 >> RDI 9.8, SpO2 low 89%.   Will have my nurse inform pt that sleep study shows mild sleep apnea.  Please arrange ROV with me to discuss in more detail.  Okay to double book visit.

## 2016-11-02 NOTE — Procedures (Signed)
    Patient Name: Joy Jimenez, Joy Jimenez Date: 10/18/2016 Gender: Female D.O.B: 09-21-1945 Age (years): 79 Referring Provider: Chesley Mires MD, ABSM Height (inches): 63 Interpreting Physician: Chesley Mires MD, ABSM Weight (lbs): 204 RPSGT: Madelon Lips BMI: 36 MRN: [id number] Neck Size: 14.00  CLINICAL INFORMATION Sleep Study Type: NPSG  Indication for sleep study: OSA  Epworth Sleepiness Score:  SLEEP STUDY TECHNIQUE As per the AASM Manual for the Scoring of Sleep and Associated Events v2.3 (April 2016) with a hypopnea requiring 4% desaturations.  The channels recorded and monitored were frontal, central and occipital EEG, electrooculogram (EOG), submentalis EMG (chin), nasal and oral airflow, thoracic and abdominal wall motion, anterior tibialis EMG, snore microphone, electrocardiogram, and pulse oximetry.  MEDICATIONS Medications self-administered by patient taken the night of the study : N/A  SLEEP ARCHITECTURE The study was initiated at 10:47:27 PM and ended at 4:51:13 AM.  Sleep onset time was 66.3 minutes and the sleep efficiency was 33.8%. The total sleep time was 123.0 minutes.  Stage REM latency was N/A minutes.  The patient spent 20.33% of the night in stage N1 sleep, 79.67% in stage N2 sleep, 0.00% in stage N3 and 0.00% in REM.  Alpha intrusion was absent.  Supine sleep was 0.00%.  RESPIRATORY PARAMETERS The overall respiratory disturbance index (RDI) was 9.8 per hour. There were 1 total apneas, including 0 obstructive, 1 central and 0 mixed apneas. There were 6 hypopneas and 13 RERAs.  The AHI during Stage REM sleep was N/A per hour.  AHI while supine was N/A per hour.  The mean oxygen saturation was 91.88%. The minimum SpO2 during sleep was 89.00%.  Moderate snoring was noted during this study.  CARDIAC DATA The 2 lead EKG demonstrated sinus rhythm. The mean heart rate was 58.29 beats per minute. Other EKG findings include: None.  LEG MOVEMENT  DATA The total PLMS were 0 with a resulting PLMS index of 0.00. Associated arousal with leg movement index was 0.0 .  IMPRESSIONS - This study showed mild sleep apnea with an RDI of 9.8 and SpO2 low of 89%.  DIAGNOSIS - Sleep apnea, unspecified (G47.30)  RECOMMENDATIONS - Additional therapies include weight loss, CPAP, oral appliance, or surgical assessment.  [Electronically signed] 11/02/2016 01:45 PM  Chesley Mires MD, [place], American Board of Sleep Medicine   NPI: [id number]

## 2016-11-04 NOTE — Telephone Encounter (Signed)
lmomtcb x 2 for the pt to call back about her results.

## 2016-11-04 NOTE — Telephone Encounter (Signed)
LM x 1 

## 2016-11-04 NOTE — Telephone Encounter (Signed)
Pt returned phone, please call back this afternoon, pt contact # [phone].Marland Kitchenert

## 2016-11-15 ENCOUNTER — Ambulatory Visit: Payer: Medicare HMO | Admitting: Internal Medicine

## 2016-11-17 NOTE — Telephone Encounter (Signed)
Called spoke with patient and discussed sleep study results/recs as stated by VS Pt voiced her understanding Appt scheduled w/ VS for 9.21.18 @ 1330

## 2016-11-24 ENCOUNTER — Encounter: Payer: Self-pay | Admitting: Internal Medicine

## 2016-11-24 ENCOUNTER — Ambulatory Visit (INDEPENDENT_AMBULATORY_CARE_PROVIDER_SITE_OTHER): Payer: Medicare HMO | Admitting: Internal Medicine

## 2016-11-24 VITALS — BP 118/72 | HR 64 | Temp 97.6°F | Ht 63.0 in | Wt 204.0 lb

## 2016-11-24 DIAGNOSIS — E785 Hyperlipidemia, unspecified: Secondary | ICD-10-CM | POA: Diagnosis not present

## 2016-11-24 DIAGNOSIS — E039 Hypothyroidism, unspecified: Secondary | ICD-10-CM | POA: Diagnosis not present

## 2016-11-24 DIAGNOSIS — N939 Abnormal uterine and vaginal bleeding, unspecified: Secondary | ICD-10-CM | POA: Diagnosis not present

## 2016-11-24 DIAGNOSIS — Z1159 Encounter for screening for other viral diseases: Secondary | ICD-10-CM

## 2016-11-24 DIAGNOSIS — R350 Frequency of micturition: Secondary | ICD-10-CM | POA: Diagnosis not present

## 2016-11-24 DIAGNOSIS — R69 Illness, unspecified: Secondary | ICD-10-CM | POA: Diagnosis not present

## 2016-11-24 DIAGNOSIS — F32A Depression, unspecified: Secondary | ICD-10-CM

## 2016-11-24 DIAGNOSIS — F329 Major depressive disorder, single episode, unspecified: Secondary | ICD-10-CM

## 2016-11-24 LAB — POCT URINALYSIS DIP (MANUAL ENTRY)
Bilirubin, UA: NEGATIVE
Blood, UA: NEGATIVE
Glucose, UA: NEGATIVE mg/dL
Ketones, POC UA: NEGATIVE mg/dL
Nitrite, UA: NEGATIVE
Protein Ur, POC: NEGATIVE mg/dL
Spec Grav, UA: 1.02 (ref 1.010–1.025)
Urobilinogen, UA: 0.2 E.U./dL
pH, UA: 5.5 (ref 5.0–8.0)

## 2016-11-24 LAB — POCT UA - MICROSCOPIC ONLY: Epithelial cells, urine per micros: >20
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
## 2016-11-24 NOTE — Patient Instructions (Signed)
I want to check your  thyroid and lipids today. I'm going to repeat the ultrasound of the thyroid as well as do an ultrasound on your uterus to determine if there are any abnormalities. Therefore when you see the gynecologist this coming month they will have that information. We'll also check your urine for any signs of infection today.

## 2016-11-25 ENCOUNTER — Encounter: Payer: Self-pay | Admitting: Pulmonary Disease

## 2016-11-25 ENCOUNTER — Ambulatory Visit (INDEPENDENT_AMBULATORY_CARE_PROVIDER_SITE_OTHER): Payer: Medicare HMO | Admitting: Pulmonary Disease

## 2016-11-25 ENCOUNTER — Telehealth: Payer: Self-pay | Admitting: Internal Medicine

## 2016-11-25 VITALS — BP 122/76 | HR 84 | Ht 63.0 in | Wt 202.4 lb

## 2016-11-25 DIAGNOSIS — R350 Frequency of micturition: Secondary | ICD-10-CM

## 2016-11-25 DIAGNOSIS — G4730 Sleep apnea, unspecified: Secondary | ICD-10-CM | POA: Diagnosis not present

## 2016-11-25 LAB — HEPATITIS C ANTIBODY (REFLEX): HCV Ab: <0.1 s/co ratio (ref 0.0–0.9)

## 2016-11-25 LAB — LIPID PANEL
Chol/HDL Ratio: 2.1 ratio (ref 0.0–4.4)
Cholesterol, Total: 145 mg/dL (ref 100–199)
HDL: 69 mg/dL (ref >39)
LDL Calculated: 55 mg/dL (ref 0–99)
Triglycerides: 105 mg/dL (ref 0–149)
VLDL Cholesterol Cal: 21 mg/dL (ref 5–40)

## 2016-11-25 LAB — HCV COMMENT:

## 2016-11-25 LAB — TSH: TSH: 1.72 u[IU]/mL (ref 0.450–4.500)

## 2016-11-25 MED ORDER — CEPHALEXIN 500 MG PO CAPS: 500.0000 mg | ORAL_CAPSULE | Freq: Two times a day (BID) | ORAL | 0 refills | Status: DC

## 2016-11-25 NOTE — Patient Instructions (Signed)
Will arrange for CPAP set up  Follow up in 3 months

## 2016-11-25 NOTE — Telephone Encounter (Signed)
Tried to call patient to let her know we will treat her for possibly UTI. Please give patient a call again as I have not been able to get in touch. I will send in a prescription for Keflex for patient to take for 7 days.

## 2016-11-25 NOTE — Progress Notes (Signed)
Current Outpatient Prescriptions on File Prior to Visit  Medication Sig  . acetaminophen (TYLENOL) 650 MG CR tablet Take 1,300 mg by mouth every 8 (eight) hours as needed for pain.  Marland Kitchen allopurinol (ZYLOPRIM) 300 MG tablet Take 300 mg by mouth daily.  . Ascorbic Acid (VITAMIN C PO) Take 1 tablet by mouth daily.  . Baclofen 5 MG TABS Take 5 mg by mouth 2 (two) times daily as needed.  . Cholecalciferol (VITAMIN D3) 3000 UNITS TABS Take by mouth daily.   . cyanocobalamin ([employer] VITAMIN B12) 1000 MCG tablet Take 500 mcg by mouth daily. Take two tabs daily  . dexlansoprazole (DEXILANT) 60 MG capsule Take 60 mg by mouth daily.  Marland Kitchen escitalopram (LEXAPRO) 20 MG tablet Take 20 mg by mouth daily.   Marland Kitchen levothyroxine (SYNTHROID) 75 MCG tablet Take 75 mcg by mouth daily before breakfast.  . rosuvastatin (CRESTOR) 10 MG tablet Take 10 mg by mouth daily.   No current facility-administered medications on file prior to visit.      Chief Complaint  Patient presents with  . Follow-up    Pt here for followup to discuss sleep study results. Pt has new pillow sleeping better each night.      Sleep tests PSG 10/18/16 >> RDI 9.8, SpO2 low 89%  Past medical history Depression, GERD, HLD, Back pain, TIA, HH, Orthostatic hypotension, Hypothyroidism, Gout  Past surgical history, Family history, Social history, Allergies all reviewed.  Vital Signs BP 122/76 (BP Location: Right Arm, Cuff Size: Normal)   Pulse 84   Ht 5\' 3"  (1.6 m)   Wt 202 lb 6.4 oz (91.8 kg)   SpO2 95%   BMI 35.85 kg/m   History of Present Illness Joy Jimenez is a 71 y.o. female with sleep apnea.  She had sleep study in August.  This showed mild sleep apnea.  She is switching to medicare.  She still has trouble staying asleep.  She wakes up frequently to use the bathroom.   Physical Exam  General - No distress ENT - No sinus tenderness, no oral exudate, no LAN, MP 4 Cardiac - s1s2 regular, no murmur Chest - No  wheeze/rales/dullness Back - No focal tenderness Abd - Soft, non-tender Ext - No edema Neuro - Normal strength Skin - No rashes Psych - normal mood, and behavior   Assessment/Plan  Sleep apnea. - We discussed how sleep apnea can affect various health problems, including risks for hypertension, cardiovascular disease, and diabetes.  We also discussed how sleep disruption can increase risks for accidents, such as while driving.  Weight loss as a means of improving sleep apnea was also reviewed.  Additional treatment options discussed were CPAP therapy, oral appliance, and surgical intervention. - will try to arrange for auto CPAP assuming she can get insurance coverage for this   Patient Instructions  Will arrange for CPAP set up  Follow up in 3 months    Chesley Mires, MD [facility] Pager:  [phone] 11/25/2016, 1:57 PM
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
## 2016-11-28 NOTE — Assessment & Plan Note (Signed)
Previously ultrasound done in 2014 which showed a left dominant thyroid nodule patient states she thinks she's had a biopsy previously. I am concerned about patient's ability to report on her medical history.  - Obtain TSH  - Repeat thyroid ultrasound  - Follow up in 1 month

## 2016-11-28 NOTE — Assessment & Plan Note (Signed)
Notes she is having intermittent postmenopausal vaginal bleeding for the past 7 years. She states she has not had this followed up on this every. Patient has an upcoming gynecology appointment, will likely work up with endometrial biopsy  - US Transvaginal Non-OB; Future - US Pelvis Complete; Future

## 2016-11-28 NOTE — Assessment & Plan Note (Addendum)
Increased urinary frequency and urgency 6 months possibly infection urgency incontinence in the differential. No concern for stress incontinence given lack association with laughing, coughing etc. -Obtain UA to determine if there is any source of infection  - Patient to follow with gynecology,

## 2016-11-30 NOTE — Telephone Encounter (Signed)
Pt informed.  She already picked up her rx.   She wanted to let Dr. Emmaline Life know that she needs a referral in the system so that she can go to see Dr. Paula Compton after her u/s . Fleeger, Salome Spotted, CMA

## 2016-12-06 ENCOUNTER — Ambulatory Visit (INDEPENDENT_AMBULATORY_CARE_PROVIDER_SITE_OTHER): Payer: Medicare HMO | Admitting: Endocrinology

## 2016-12-06 ENCOUNTER — Encounter: Payer: Self-pay | Admitting: Endocrinology

## 2016-12-06 DIAGNOSIS — E041 Nontoxic single thyroid nodule: Secondary | ICD-10-CM | POA: Diagnosis not present

## 2016-12-06 NOTE — Patient Instructions (Addendum)
Please continue the same thyroid medication. The next step is to see the results from the ultrasound you will have done in 2 days.  Sometimes people need a second biopsy.  Please come back for a follow-up appointment in 1 year.

## 2016-12-06 NOTE — Progress Notes (Signed)
Subjective:    Patient ID: Joy Jimenez, female    DOB: 1946/02/12, 71 y.o.   MRN: [id number]  HPI Pt is referred by Dr Emmaline Life, for nodular thyroid.  Pt was noted to have a nodule at the thyroid in 2013.  she has no h/o XRT or surgery to the neck.  She has been on synthroid since 2013.  She has moderate fatigue, and assoc hair loss. She had bx in [place], in 2016.  Past Medical History:  Diagnosis Date  . Achilles tendinitis   . Depression   . Diverticulosis   . GERD (gastroesophageal reflux disease)   . Heart murmur   . History of chicken pox   . Hyperlipidemia   . Low back pain   . Mini stroke ([place]) 01/14/08  . Mitral valve disorder 2007   "mitral valve leakage"  . Neuromuscular disorder ([place]) 2007   hiatal hernia  . Orthostatic hypotension     Past Surgical History:  Procedure Laterality Date  . CHOLECYSTECTOMY  06/06/83  . TONSILLECTOMY     71 years old    Social History   Social History  . Marital status: Divorced    Spouse name: N/A  . Number of children: 1  . Years of education: N/A   Occupational History  . retired    Social History Main Topics  . Smoking status: Never Smoker  . Smokeless tobacco: Never Used  . Alcohol use No     Comment: once or twice a year - small amount  . Drug use: No  . Sexual activity: No   Other Topics Concern  . Not on file   Social History Narrative   Caffeine use:  3 beverages a week   Regular exercise:  No   Therapist, sports- retired, does some Freight forwarder on the side.   Has a daughter who lives in [place].   3 sisters- live locally.          Current Outpatient Prescriptions on File Prior to Visit  Medication Sig Dispense Refill  . acetaminophen (TYLENOL) 650 MG CR tablet Take 1,300 mg by mouth every 8 (eight) hours as needed for pain.    Marland Kitchen allopurinol (ZYLOPRIM) 300 MG tablet Take 300 mg by mouth daily.    . Ascorbic Acid (VITAMIN C PO) Take 1 tablet by mouth  daily.    . Baclofen 5 MG TABS Take 5 mg by mouth 2 (two) times daily as needed. 20 tablet 0  . Cholecalciferol (VITAMIN D3) 3000 UNITS TABS Take by mouth daily.     . cyanocobalamin (CVS VITAMIN B12) 1000 MCG tablet Take 500 mcg by mouth daily. Take two tabs daily    . dexlansoprazole (DEXILANT) 60 MG capsule Take 60 mg by mouth daily.    Marland Kitchen escitalopram (LEXAPRO) 20 MG tablet Take 20 mg by mouth daily.     Marland Kitchen levothyroxine (SYNTHROID) 75 MCG tablet Take 75 mcg by mouth daily before breakfast.    . rosuvastatin (CRESTOR) 10 MG tablet Take 10 mg by mouth daily.     No current facility-administered medications on file prior to visit.     Allergies  Allergen Reactions  . Risperdal [Risperidone] Other (See Comments)    Blurred Vision   . Tegretol [Carbamazepine] Other (See Comments)    Blurred vision    Family History  Problem Relation Age of Onset  . Alcohol abuse Mother   . Heart disease Mother   .  Stroke Mother   . Hypertension Mother   . Depression Mother   . Heart disease Father   . Coronary artery disease Father   . Depression Father   . Depression Sister   . Hypertension Sister   . Alcohol abuse Sister   . Cancer Maternal Aunt        breast  . Hypertension Maternal Aunt   . Cancer Cousin        breast  . Thyroid disease Neg Hx     BP 116/72   Pulse 85   Wt 203 lb 12.8 oz (92.4 kg)   SpO2 96%   BMI 36.10 kg/m     Review of Systems Denies hoarseness, neck pain, visual loss, chest pain, sob, dysphagia, constipation, itching, flushing, easy bruising, headache, numbness, and rhinorrhea.  She has lost weight, after hernia surgery.  She has a slight cough, depression, and cold intolerance.        Objective:   Physical Exam VS: see vs page GEN: no distress HEAD: head: no deformity.  eyes: no periorbital swelling, no proptosis.  external nose and ears are normal.  mouth: no lesion seen.  NECK: supple, thyroid is not enlarged. I cannot palpate any nodule.     CHEST WALL: no deformity LUNGS: clear to auscultation CV: reg rate and rhythm, no murmur ABD: abdomen is soft, nontender.  no hepatosplenomegaly.  not distended.  no hernia.   MUSCULOSKELETAL: muscle bulk and strength are grossly normal.  no obvious joint swelling.  gait is normal and steady.  EXTEMITIES: no deformity.  no edema PULSES: no carotid bruit NEURO:  cn 2-12 grossly intact.   readily moves all 4's.  sensation is intact to touch on all 4's.   SKIN:  Normal texture and temperature.  No rash or suspicious lesion is visible.   NODES:  None palpable at the neck.  PSYCH: alert, well-oriented.  Does not appear anxious nor depressed.     [name]: (2013): 14 mm complex left thyroid nodule, which does not meet consensus criteria for biopsy.        (2014) A complex solid nodule with cystic components in the left mid/lower pole measures 2.9 x 2.0 x 1.4 cm and contains calcifications.    Lab Results  Component Value Date   TSH 1.720 11/24/2016   Pt signs release of information from [facility] and from [place].     Assessment & Plan:  Thyroid nodule, new to me, prob benign.    Patient Instructions  Please continue the same thyroid medication. The next step is to see the results from the ultrasound you will have done in 2 days.  Sometimes people need a second biopsy.  Please come back for a follow-up appointment in 1 year.

## 2016-12-08 ENCOUNTER — Ambulatory Visit (HOSPITAL_COMMUNITY)
Admission: RE | Admit: 2016-12-08 | Discharge: 2016-12-08 | Disposition: A | Discharge status: Home / Self Care | Payer: Medicare HMO | Source: Ambulatory Visit | Attending: Family Medicine | Admitting: Family Medicine

## 2016-12-08 ENCOUNTER — Telehealth: Payer: Self-pay | Admitting: Endocrinology

## 2016-12-08 DIAGNOSIS — E041 Nontoxic single thyroid nodule: Secondary | ICD-10-CM | POA: Insufficient documentation

## 2016-12-08 DIAGNOSIS — N939 Abnormal uterine and vaginal bleeding, unspecified: Secondary | ICD-10-CM | POA: Insufficient documentation

## 2016-12-08 DIAGNOSIS — E039 Hypothyroidism, unspecified: Secondary | ICD-10-CM | POA: Diagnosis not present

## 2016-12-08 DIAGNOSIS — N950 Postmenopausal bleeding: Secondary | ICD-10-CM | POA: Diagnosis not present

## 2016-12-08 NOTE — Telephone Encounter (Signed)
please call patient: [name] is unchanged.  No further testing is needed now--good.  Please come back for a follow-up appointment in 1 year.

## 2016-12-09 ENCOUNTER — Telehealth: Payer: Self-pay | Admitting: *Deleted

## 2016-12-09 NOTE — Telephone Encounter (Signed)
Patient called our office and left message on triage nurse voice mail.  States she fell down 2 steps yesterday and hurt hand, ribs, cut lip.  Requesting [name] to schedule xray to rule out any fracture.    Patient will need an appt to be evaluated; need to assess if xray is needed.  Called and left message on voice mail to call our office back.  Burna Forts, BSN, RN-BC

## 2016-12-09 NOTE — Telephone Encounter (Signed)
Called and left patient detailed VM.

## 2016-12-14 ENCOUNTER — Telehealth: Payer: Self-pay | Admitting: Endocrinology

## 2016-12-14 NOTE — Telephone Encounter (Signed)
Patient is calling for the results of her ultrasound, she stated Dr Loanne Drilling would talk to her about it. Please advise

## 2016-12-16 NOTE — Telephone Encounter (Signed)
Here is the VM sarah left: [name] is unchanged.  No further testing is needed now--good.  Please come back for a follow-up appointment in 1 year.

## 2016-12-16 NOTE — Telephone Encounter (Signed)
Patient notified & had no further questions for now. She stated she would call back to make f/u appt.

## 2016-12-23 ENCOUNTER — Ambulatory Visit (INDEPENDENT_AMBULATORY_CARE_PROVIDER_SITE_OTHER): Payer: Medicare HMO | Admitting: Internal Medicine

## 2016-12-23 ENCOUNTER — Encounter: Payer: Self-pay | Admitting: Internal Medicine

## 2016-12-23 VITALS — BP 106/70 | HR 72 | Temp 98.0°F | Ht 63.0 in | Wt 203.0 lb

## 2016-12-23 DIAGNOSIS — N939 Abnormal uterine and vaginal bleeding, unspecified: Secondary | ICD-10-CM

## 2016-12-23 DIAGNOSIS — G4730 Sleep apnea, unspecified: Secondary | ICD-10-CM

## 2016-12-23 DIAGNOSIS — W19XXXA Unspecified fall, initial encounter: Secondary | ICD-10-CM | POA: Diagnosis not present

## 2016-12-23 NOTE — Patient Instructions (Addendum)
We will check your Vitamin D levels today. We will check on your wrist. I have placed another referral to OBGYN. Please follow up with them.

## 2016-12-23 NOTE — Assessment & Plan Note (Signed)
Had a fall about two weeks ago. No significant swelling on exam. Patient indicates some pain with palpation of the left wrist.  - Unlikely any boney fracture, however will check per patient  - No further work up needed

## 2016-12-23 NOTE — Progress Notes (Deleted)
   [facility] [name], MD Phone: [phone]  Reason For Visit:   # *** -   Past Medical History Reviewed problem list.  Medications- reviewed and updated No additions to family history Social history- patient is a *** smoker  Objective: BP 106/70   Pulse 72   Temp 98 F (36.7 C) (Oral)   Ht 5\' 3"  (1.6 m)   Wt 203 lb (92.1 kg)   SpO2 98%   BMI 35.96 kg/m  Gen: NAD, alert, cooperative with exam HEENT: Normal    Neck: No masses palpated. No lymphadenopathy    Ears: Tympanic membranes intact, normal light reflex, no erythema, no bulging    Eyes: PERRLA, EOMI    Nose: nasal turbinates moist    Throat: moist mucus membranes, no erythema Cardio: regular rate and rhythm, S1S2 heard, no murmurs appreciated Pulm: clear to auscultation bilaterally, no wheezes, rhonchi or rales GI: soft, non-tender, non-distended, bowel sounds present, no hepatomegaly, no splenomegaly GU: external vaginal tissue ***, cervix ***, *** punctate lesions on cervix appreciated, *** discharge from cervical os, *** bleeding, *** cervical motion tenderness, *** abdominal/ adnexal masses Extremities: warm, well perfused, No edema, cyanosis or clubbing;  MSK: Normal gait and station Skin: dry, intact, no rashes or lesions Neuro: Strength and sensation grossly intact   Assessment/Plan: See problem based a/p  No problem-specific Assessment & Plan notes found for this encounter.

## 2016-12-23 NOTE — Assessment & Plan Note (Signed)
Ultrasound within normal limits patient endorses vaginal spotting monthly. She does have a history of hemorrhoids however she denies any association of spotting with or hemorrhoid irritation. - Outpatient follow-up for possible endometrial biopsy with OB/GYN per request - Ambulatory referral to Obstetrics / Gynecology

## 2016-12-23 NOTE — Progress Notes (Signed)
   [name] [name], MD Phone: [phone]  Reason For Visit: Follow up   # Fall on October 4th - Patient states she fell forward. Hit her upper/lower lip on the sidewalk. She states her wrist was hurting and she has couple of cuts. Patient denies any loss of consciousness. Patient did take anything for the pain. Most of the bumps and scratches have since healed.  However patient still with some pain in her left wrist.   # Vaginal bleeding  - Patient indicates still having vaginal bleeding every month, on regular basis. Patient indicates spotting on her underwear. Patient indicates having a hx of hemorrhoids. Denies the spotting associated with bowel movements. Denies any hemorrhoid irritation  - Ultrasound without any abnormalities  - Sometimes has Intermittent abdominal pain and bloating every once in a while    Past Medical History Reviewed problem list.  Medications- reviewed and updated No additions to family history Social history- patient is a non smoker  Objective: BP 106/70   Pulse 72   Temp 98 F (36.7 C) (Oral)   Ht 5\' 3"  (1.6 m)   Wt 203 lb (92.1 kg)   SpO2 98%   BMI 35.96 kg/m  Gen: NAD, alert, cooperative with exam Cardio: regular rate and rhythm, S1S2 heard, no murmurs appreciated MSK: Some slight tenderness on palpation of wrist, normal range of motion of wrists, no bruising or swelling. Skin: Small healed scratch on her right tibia     Assessment/Plan: See problem based a/p  Fall Had a fall about two weeks ago. No significant swelling on exam. Patient indicates some pain with palpation of the left wrist.  - Unlikely any boney fracture, however will check per patient  - No further work up needed   Vaginal bleeding Ultrasound within normal limits patient endorses vaginal spotting monthly. She does have a history of hemorrhoids however she denies any association of spotting with or hemorrhoid irritation. - Outpatient  follow-up for possible endometrial biopsy with OB/GYN per request - Ambulatory referral to Obstetrics / Gynecology

## 2016-12-24 LAB — VITAMIN D 25 HYDROXY (VIT D DEFICIENCY, FRACTURES): Vit D, 25-Hydroxy: 49.1 ng/mL (ref 30.0–100.0)

## 2016-12-27 ENCOUNTER — Encounter: Payer: Self-pay | Admitting: Internal Medicine

## 2016-12-28 ENCOUNTER — Encounter: Payer: Self-pay | Admitting: Obstetrics and Gynecology

## 2017-01-16 ENCOUNTER — Telehealth: Payer: Self-pay | Admitting: General Practice

## 2017-01-16 ENCOUNTER — Encounter: Payer: Medicare HMO | Admitting: Obstetrics and Gynecology

## 2017-01-16 NOTE — Telephone Encounter (Signed)
Received message to give patient a call to reschedule appointment.  Called patient and she stated that she just wanted to cancel appointment for now and that she would give our office a call back to reschedule.

## 2017-02-13 ENCOUNTER — Ambulatory Visit: Payer: Medicare HMO | Admitting: Adult Health

## 2017-02-22 ENCOUNTER — Encounter: Payer: Self-pay | Admitting: Pulmonary Disease

## 2017-02-22 ENCOUNTER — Ambulatory Visit (INDEPENDENT_AMBULATORY_CARE_PROVIDER_SITE_OTHER): Payer: Medicare Other | Admitting: Pulmonary Disease

## 2017-02-22 VITALS — BP 118/80 | HR 58 | Ht 63.0 in | Wt 203.0 lb

## 2017-02-22 DIAGNOSIS — G4730 Sleep apnea, unspecified: Secondary | ICD-10-CM

## 2017-02-22 NOTE — Patient Instructions (Signed)
Will arrange for new home care company to get set up with auto CPAP machine  Follow up in 3 months after getting CPAP

## 2017-02-22 NOTE — Progress Notes (Signed)
[facility]  Chief Complaint  Patient presents with  . Follow-up    Pt advised that Medicare will not provide patient with cpap machine until after Jan 2019. Pt would like to go to different DME other than [facility] for new year 2019. Pt had no cpap machine for 3 months patient is gasping for air in middle of the night, and not sleeping. Pt has dry cough more in the mornings, and has SOB with exertion.    Vital signs: BP 118/80 (BP Location: Left Arm, Cuff Size: Normal)   Pulse (!) 58   Ht 5\' 3"  (1.6 m)   Wt 203 lb (92.1 kg)   SpO2 98%   BMI 35.96 kg/m   History of Present Illness: Joy Jimenez is a 71 y.o. female with OSA.  She hasn't received CPAP yet.  She was told by [facility] that she needed to have a debit card on file before getting her CPAP.  She is still having trouble with her sleep, apnea, and feeling short of breath at night.   Physical Exam:  General - pleasant Eyes - pupils reactive, wears glasses ENT - no sinus tenderness, no oral exudate, no LAN, MP 4 Cardiac - regular, no murmur Chest - no wheeze, rales Abd - soft, non tender Ext - no edema Skin - no rashes Neuro - normal strength Psych - normal mood   Assessment/Plan:  Sleep apnea, not specified. - will try to arrange for different DME to get auto CPAP set up   Patient Instructions  Will arrange for new home care company to get set up with auto CPAP machine  Follow up in 3 months after getting CPAP    Chesley Mires, MD [place] 02/22/2017, 12:34 PM Pager:  [phone]  Flow Sheet  Pulmonary tests:  Sleep tests: PSG 10/18/16 >> RDI 9.8, SpO2 low 89%  Cardiac tests:  Past Medical History: She  has a past medical history of Achilles tendinitis, Depression, Diverticulosis, GERD (gastroesophageal reflux disease), Heart murmur, History of chicken pox, Hyperlipidemia, Low back pain, Mini stroke ([place]) (01/14/08), Mitral valve disorder  (2007), Neuromuscular disorder ([place]) (2007), and Orthostatic hypotension.  Past Surgical History: She  has a past surgical history that includes Cholecystectomy (06/06/83) and Tonsillectomy.  Family History: Her family history includes Alcohol abuse in her mother and sister; Cancer in her cousin and maternal aunt; Coronary artery disease in her father; Depression in her father, mother, and sister; Heart disease in her father and mother; Hypertension in her maternal aunt, mother, and sister; Stroke in her mother.  Social History: She  reports that  has never smoked. she has never used smokeless tobacco. She reports that she does not drink alcohol or use drugs.  Medications: Allergies as of 02/22/2017      Reactions   Risperdal [risperidone] Other (See Comments)   Blurred Vision   Tegretol [carbamazepine] Other (See Comments)   Blurred vision      Medication List        Accurate as of 02/22/17 12:34 PM. Always use your most recent med list.          acetaminophen 650 MG CR tablet Commonly known as:  TYLENOL Take 1,300 mg by mouth every 8 (eight) hours as needed for pain.   Baclofen 5 MG Tabs Take 5 mg by mouth 2 (two) times daily as needed.   CVS VITAMIN B12 1000 MCG tablet Generic drug:  cyanocobalamin Take 500 mcg by mouth daily.  Take two tabs daily   DEXILANT 60 MG capsule Generic drug:  dexlansoprazole Take 60 mg by mouth daily.   escitalopram 20 MG tablet Commonly known as:  LEXAPRO Take 20 mg by mouth daily.   rosuvastatin 10 MG tablet Commonly known as:  CRESTOR Take 10 mg by mouth daily.   SYNTHROID 75 MCG tablet Generic drug:  levothyroxine Take 75 mcg by mouth daily before breakfast.   VITAMIN C PO Take 1 tablet by mouth daily.   Vitamin D3 3000 units Tabs Take by mouth daily.

## 2017-05-15 ENCOUNTER — Encounter: Payer: Self-pay | Admitting: Pulmonary Disease

## 2017-05-15 ENCOUNTER — Ambulatory Visit (INDEPENDENT_AMBULATORY_CARE_PROVIDER_SITE_OTHER): Payer: Medicare Other | Admitting: Pulmonary Disease

## 2017-05-15 VITALS — BP 114/78 | HR 76 | Ht 63.0 in | Wt 202.0 lb

## 2017-05-15 DIAGNOSIS — Z9989 Dependence on other enabling machines and devices: Secondary | ICD-10-CM

## 2017-05-15 DIAGNOSIS — G4733 Obstructive sleep apnea (adult) (pediatric): Secondary | ICD-10-CM

## 2017-05-15 NOTE — Patient Instructions (Signed)
Try adjusting the setting for your CPAP humidifier Will have your CPAP pressure range reduced Call if you are still having trouble with mouth dryness  Follow up in 1 year

## 2017-05-15 NOTE — Progress Notes (Signed)
[place] [facility]  Chief Complaint  Patient presents with  . Follow-up    Pt is doing well overall with cpap machine    Vital signs: BP 114/78 (BP Location: Left Arm, Cuff Size: Normal)   Pulse 76   Ht 5\' 3"  (1.6 m)   Wt 202 lb (91.6 kg)   SpO2 97%   BMI 35.78 kg/m   History of Present Illness: Joy Jimenez is a 72 y.o. female with OSA.  She is doing well with CPAP.  Sleeping better and more alert.  Pressure okay.  She is getting mouth dryness.  Physical Exam:  General - pleasant Eyes - pupils reactive, wears glasses ENT - no sinus tenderness, no oral exudate, no LAN Cardiac - regular, no murmur Chest - no wheeze, rales Abd - soft, non tender Ext - no edema Skin - no rashes Neuro - normal strength Psych - normal mood   Assessment/Plan:  Obstructive sleep apnea. - she is compliant with CPAP and reports benefit from therapy - will change her auto CPAP to 6 to 12 cm H2O - discussed how to adjust her humidifier settings   Patient Instructions  Try adjusting the setting for your CPAP humidifier Will have your CPAP pressure range reduced Call if you are still having trouble with mouth dryness  Follow up in 1 year    Chesley Mires, MD [place] 05/15/2017, 11:52 AM Pager:  [phone]  Flow Sheet  Sleep tests: PSG 10/18/16 >> RDI 9.8, SpO2 low 89% Auto CPAP 04/13/17 to 05/12/17 >> used on 24 of 30 nights with average 7 hrs 45 min.  Average AHI 1.4 with median CPAP 8 and 95 th percentile CPAP 12 cm H2O  Past Medical History: She  has a past medical history of Achilles tendinitis, Depression, Diverticulosis, GERD (gastroesophageal reflux disease), Heart murmur, History of chicken pox, Hyperlipidemia, Low back pain, Mini stroke ([place]) (01/14/08), Mitral valve disorder (2007), Neuromuscular disorder ([place]) (2007), and Orthostatic hypotension.  Past Surgical History: She  has a past surgical history that  includes Cholecystectomy (06/06/83) and Tonsillectomy.  Family History: Her family history includes Alcohol abuse in her mother and sister; Cancer in her cousin and maternal aunt; Coronary artery disease in her father; Depression in her father, mother, and sister; Heart disease in her father and mother; Hypertension in her maternal aunt, mother, and sister; Stroke in her mother.  Social History: She  reports that  has never smoked. she has never used smokeless tobacco. She reports that she does not drink alcohol or use drugs.  Medications: Allergies as of 05/15/2017      Reactions   Risperdal [risperidone] Other (See Comments)   Blurred Vision   Tegretol [carbamazepine] Other (See Comments)   Blurred vision      Medication List        Accurate as of 05/15/17 11:52 AM. Always use your most recent med list.          acetaminophen 650 MG CR tablet Commonly known as:  TYLENOL Take 1,300 mg by mouth every 8 (eight) hours as needed for pain.   Baclofen 5 MG Tabs Take 5 mg by mouth 2 (two) times daily as needed.   CVS VITAMIN B12 1000 MCG tablet Generic drug:  cyanocobalamin Take 500 mcg by mouth daily. Take two tabs daily   DEXILANT 60 MG capsule Generic drug:  dexlansoprazole Take 60 mg by mouth daily.   escitalopram 20 MG tablet Commonly known as:  LEXAPRO Take 20 mg by mouth daily.   rosuvastatin 10 MG tablet Commonly known as:  CRESTOR Take 10 mg by mouth daily.   SYNTHROID 75 MCG tablet Generic drug:  levothyroxine Take 75 mcg by mouth daily before breakfast.   VITAMIN C PO Take 1 tablet by mouth daily.   Vitamin D3 3000 units Tabs Take by mouth daily.

## 2017-07-26 ENCOUNTER — Other Ambulatory Visit: Payer: Self-pay | Admitting: Physician Assistant

## 2017-07-26 DIAGNOSIS — R519 Headache, unspecified: Secondary | ICD-10-CM

## 2017-07-26 DIAGNOSIS — R51 Headache: Principal | ICD-10-CM

## 2017-08-09 ENCOUNTER — Ambulatory Visit
Admission: RE | Admit: 2017-08-09 | Discharge: 2017-08-09 | Disposition: A | Discharge status: Home / Self Care | Payer: Medicare Other | Source: Ambulatory Visit | Attending: Physician Assistant | Admitting: Physician Assistant

## 2017-08-09 DIAGNOSIS — R51 Headache: Principal | ICD-10-CM

## 2017-08-09 DIAGNOSIS — R519 Headache, unspecified: Secondary | ICD-10-CM

## 2017-08-17 ENCOUNTER — Other Ambulatory Visit: Payer: Self-pay | Admitting: Physician Assistant

## 2017-08-17 DIAGNOSIS — R1906 Epigastric swelling, mass or lump: Secondary | ICD-10-CM

## 2017-09-14 ENCOUNTER — Ambulatory Visit
Admission: RE | Admit: 2017-09-14 | Discharge: 2017-09-14 | Disposition: A | Discharge status: Home / Self Care | Payer: Medicare Other | Source: Ambulatory Visit | Attending: Physician Assistant | Admitting: Physician Assistant

## 2017-09-14 DIAGNOSIS — R1906 Epigastric swelling, mass or lump: Secondary | ICD-10-CM

## 2017-09-14 MED ORDER — IOPAMIDOL (ISOVUE-300) INJECTION 61%
100.0000 mL | Freq: Once | INTRAVENOUS | Status: AC | PRN
  Administered 2017-09-14: 100 mL via INTRAVENOUS

## 2018-04-24 IMAGING — CT CT ABD-PELV W/ CM
2 of 5 series · 16 of 46 positions shown, 18 images · IV contrast (Omni 300)
Comparison: None.

CLINICAL DATA: Increased epigastric pain for the past 2 days.
Status post hiatal hernia repair on [REDACTED] at outside facility.

EXAM:
CT ABDOMEN AND PELVIS WITH CONTRAST
TECHNIQUE: Multidetector CT imaging of the abdomen and pelvis was performed
using the standard protocol following bolus administration of
intravenous contrast.
CONTRAST:  100 cc Ksovue-EGG

[Series 2: a/p w/ 5mm · axial · 0.93mm/px · z∈[-372,+18]mm · 13 of 91 slices shown, 15 images]
[im 7/91  soft-tissue]
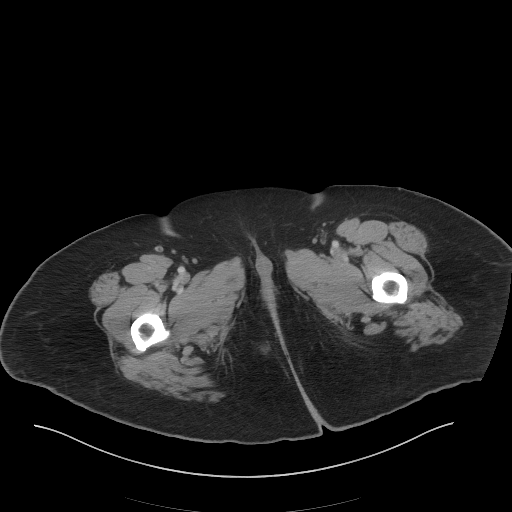
[im 7/91  bone]
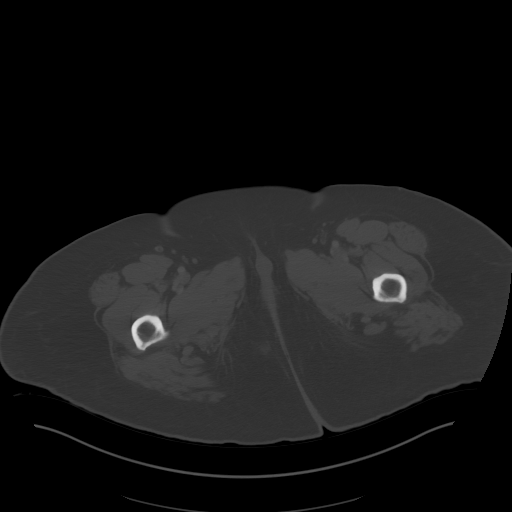
[im 13/91  soft-tissue]
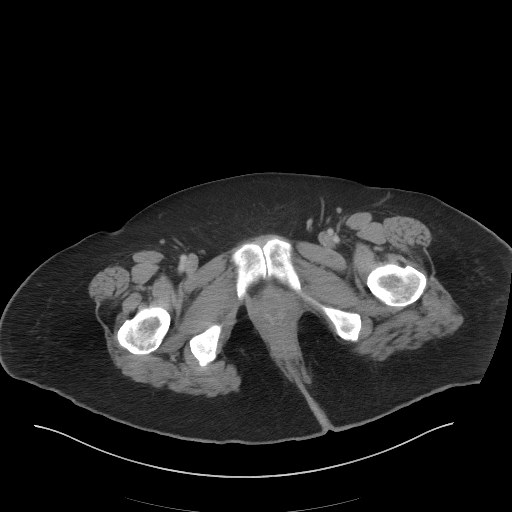
[im 19/91  soft-tissue]
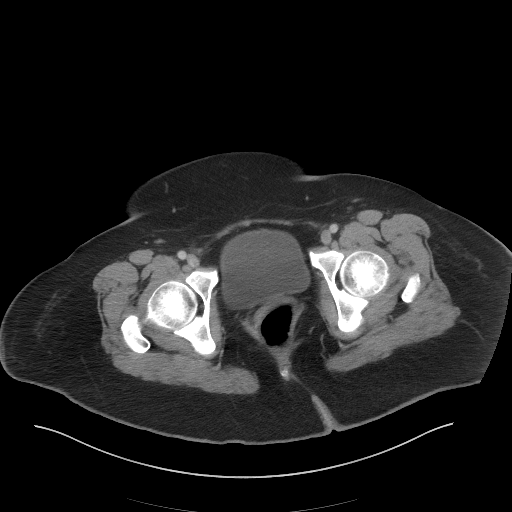
[im 25/91  soft-tissue]
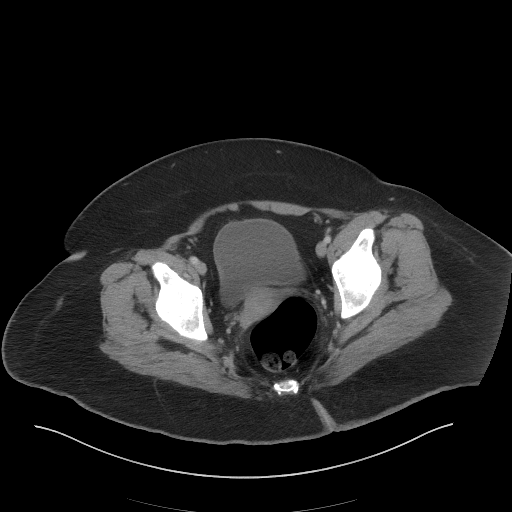
[im 31/91  soft-tissue]
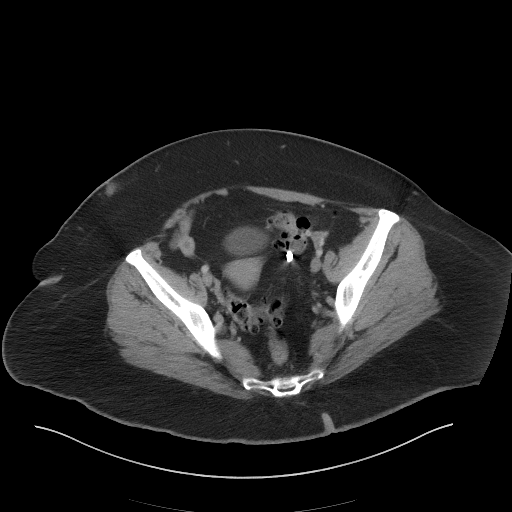
[im 37/91  soft-tissue]
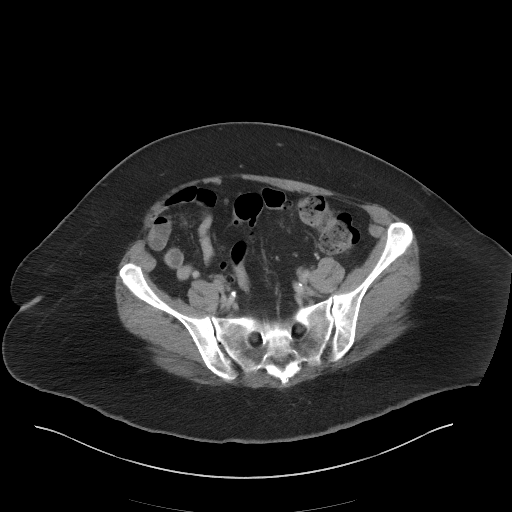
[im 49/91  soft-tissue]
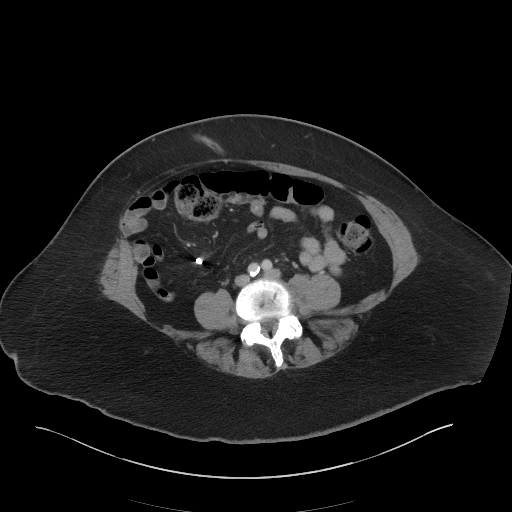
[im 55/91  soft-tissue]
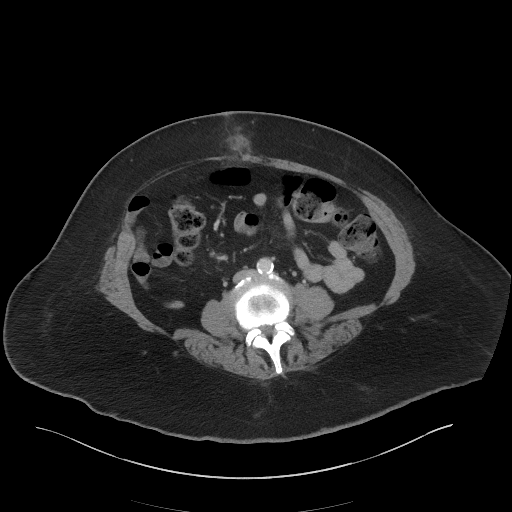
[im 61/91  soft-tissue]
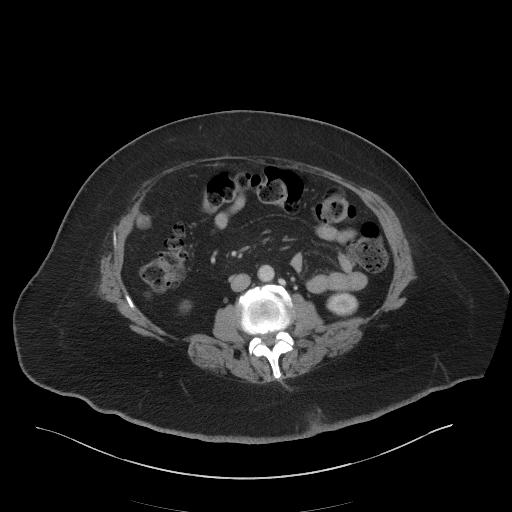
[im 61/91  bone]
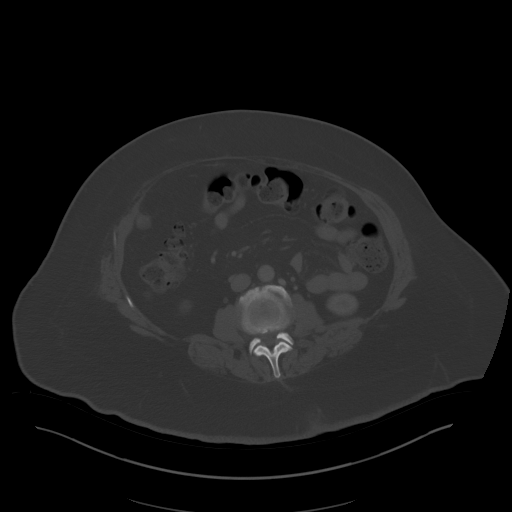
[im 67/91  soft-tissue]
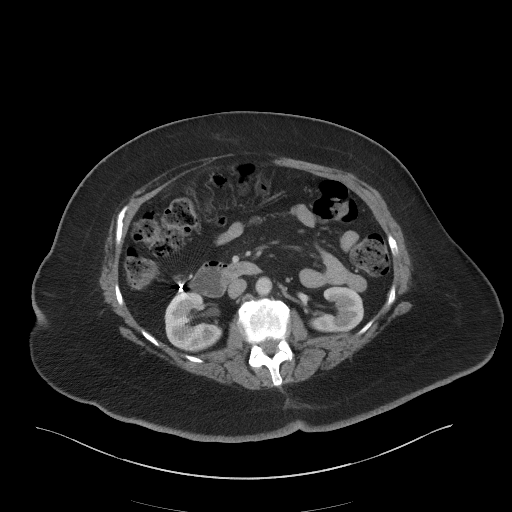
[im 73/91  soft-tissue]
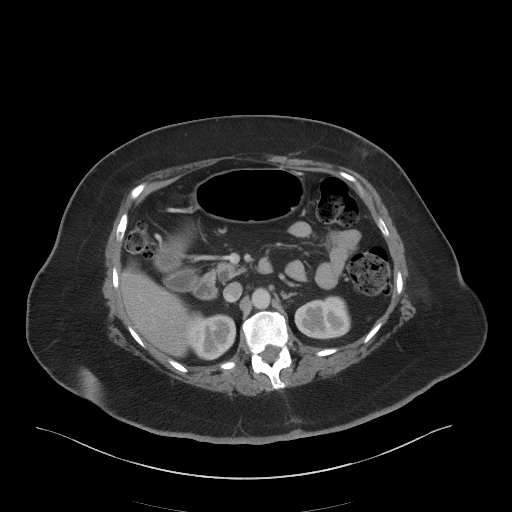
[im 79/91  soft-tissue]
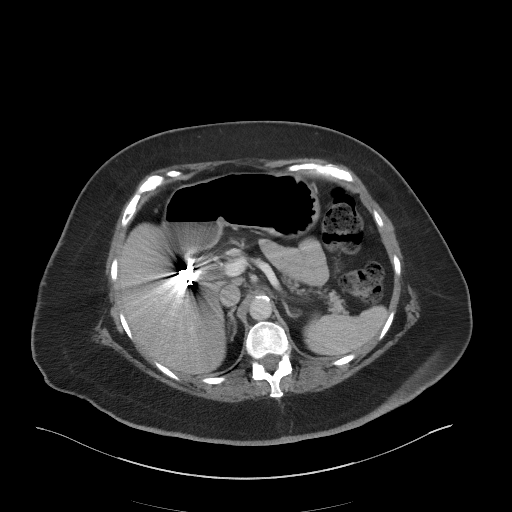
[im 85/91  soft-tissue]
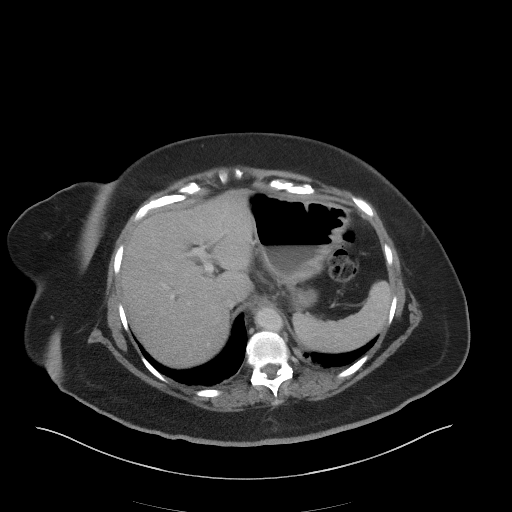

[Series 5: a/p w/ cor · coronal · 0.88mm/px · 3 of 156 slices shown]
[im 52/156  soft-tissue]
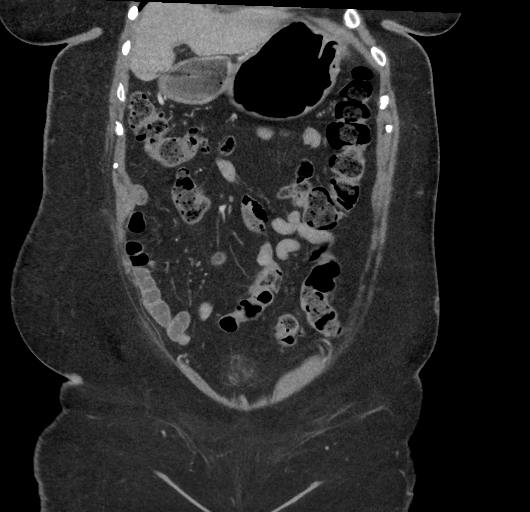
[im 69/156  soft-tissue]
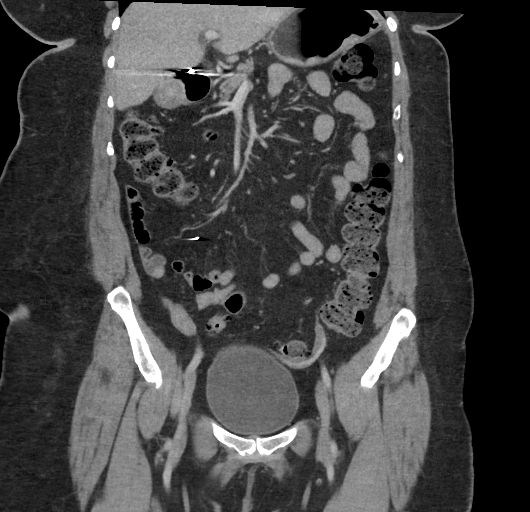
[im 87/156  soft-tissue]
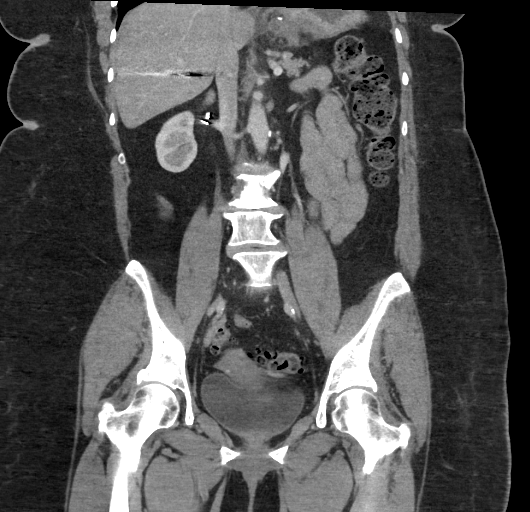

[16 of 46 positions shown; findings below may reference images not displayed]

FINDINGS: Lower chest:  Mild bibasilar atelectasis.

Hepatobiliary: Status post cholecystectomy with associated bile duct
ectasia. Liver appears normal.

Pancreas: No mass, inflammatory changes, or other significant
abnormality.

Spleen: Probable small cyst within the anterior-medial spleen.
Otherwise unremarkable.

Adrenals/Urinary Tract: Adrenal glands appear normal. Kidneys appear
normal without mass, stone or hydronephrosis. No ureteral or bladder
calculi identified. Bladder appears normal.

Stomach/Bowel: Surgical changes about the gastroesophageal hiatus,
compatible with the given history of recent hiatal hernia repair.
Expected thickening of the walls of the upper stomach and lower
esophagus, and expected small amount of fluid/ edema within the
lower paraesophageal mediastinum. No abscess collection or
extraluminal air identified in this area.

Stomach otherwise normal in appearance. No large or small bowel
dilatation. Scattered diverticulosis noted within the sigmoid colon
without evidence of acute diverticulitis. Appendix is normal.

Vascular/Lymphatic: Scattered atherosclerotic changes of the normal
caliber abdominal aorta. No enlarged lymph nodes seen.

Reproductive: No mass or other significant abnormality.

Other: No free fluid or abscess collection within the abdomen or
pelvis. No free intraperitoneal air.

Musculoskeletal: Mild degenerative change within the lumbar spine.
No acute or suspicious osseous finding. Expected postsurgical
changes within the supraumbilical anterior abdominal wall.
IMPRESSION: 1. Postsurgical changes at the gastroesophageal junction, compatible
with given history of recent hiatal hernia repair. Expected
thickening of the walls of the upper stomach and lower esophagus, an
expected small amount of fluid/edema within the lower paraesophageal
mediastinum. No abscess collection or extraluminal air identified.
2. No other acute-appearing findings. No small or large bowel
obstruction. No evidence of acute solid organ abnormality.
3. Colonic diverticulosis without evidence of acute diverticulitis.
4. Aortic atherosclerosis.

## 2018-05-23 ENCOUNTER — Telehealth: Payer: Self-pay | Admitting: Adult Health Nurse Practitioner

## 2018-05-23 NOTE — Telephone Encounter (Signed)
Spoke with patient and set up initial visit on 05/24/2018 at 11:30 [facility] K. Olena Heckle NP

## 2018-05-24 ENCOUNTER — Telehealth: Payer: Self-pay | Admitting: Adult Health Nurse Practitioner

## 2018-05-24 NOTE — Telephone Encounter (Signed)
Patient called to cancel appointment.  Rescheduled for 05/30/2018 at 1:30pm Braylin Xu K. Olena Heckle NP

## 2018-05-24 NOTE — Telephone Encounter (Signed)
Called patient for COVID screening.  Everything negative Loni Delbridge K. Olena Heckle NP

## 2018-05-30 ENCOUNTER — Telehealth: Payer: Self-pay

## 2018-05-30 ENCOUNTER — Other Ambulatory Visit: Payer: Self-pay

## 2018-05-30 ENCOUNTER — Other Ambulatory Visit: Payer: Medicare Other | Admitting: Adult Health Nurse Practitioner

## 2018-05-30 ENCOUNTER — Telehealth: Payer: Self-pay | Admitting: Adult Health Nurse Practitioner

## 2018-05-30 NOTE — Telephone Encounter (Signed)
Called patient to do COVID 19 screening.  Patient did not answer, left VM Garren Greenman K. Olena Heckle NP

## 2018-05-30 NOTE — Telephone Encounter (Signed)
Phone call placed to patient. Patient requested to delay scheduling visit with Palliative at this time due to coronavirus. Will reach back out in 2 weeks. Patient made aware to call with questions or concerns.

## 2018-06-04 ENCOUNTER — Telehealth: Payer: Self-pay | Admitting: Adult Health Nurse Practitioner

## 2018-06-04 NOTE — Telephone Encounter (Signed)
Spoke with patient to try to set up telemedicine visit.  Patient wants to wait til after coronavirus.  Does not have a reliable email or internet connection. Amy K. Olena Heckle NP

## 2018-06-20 ENCOUNTER — Telehealth: Payer: Self-pay

## 2018-06-20 NOTE — Telephone Encounter (Signed)
Phone call placed to patient to check in and to offer to schedule visit with Palliative Care. Patient answered but had to get off the phone quickly as her [employer] bus was there. Plan is to communicate another time.

## 2018-06-22 ENCOUNTER — Telehealth: Payer: Self-pay

## 2018-06-22 NOTE — Telephone Encounter (Signed)
Phone call placed to patient to inquire if she would like to reschedule a visit with Palliative care. Patient declined palliative services at this time.

## 2018-07-05 ENCOUNTER — Other Ambulatory Visit: Payer: Self-pay | Admitting: Nurse Practitioner

## 2018-07-05 DIAGNOSIS — R42 Dizziness and giddiness: Secondary | ICD-10-CM

## 2018-07-10 ENCOUNTER — Ambulatory Visit
Admission: RE | Admit: 2018-07-10 | Discharge: 2018-07-10 | Disposition: A | Discharge status: Home / Self Care | Payer: Medicare Other | Source: Ambulatory Visit | Attending: Nurse Practitioner | Admitting: Nurse Practitioner

## 2018-07-10 ENCOUNTER — Other Ambulatory Visit: Payer: Self-pay

## 2018-07-10 DIAGNOSIS — R42 Dizziness and giddiness: Secondary | ICD-10-CM

## 2018-07-10 MED ORDER — IOPAMIDOL (ISOVUE-300) INJECTION 61%
75.0000 mL | Freq: Once | INTRAVENOUS | Status: AC | PRN
  Administered 2018-07-10: 75 mL via INTRAVENOUS

## 2018-08-13 ENCOUNTER — Telehealth: Payer: Self-pay | Admitting: Neurology

## 2018-08-13 NOTE — Telephone Encounter (Signed)
Pt would like the results of her scan she had done please call

## 2018-08-13 NOTE — Telephone Encounter (Signed)
Pt has never been seen at our office.  Simona Huh, NP ordered CT of Head.   Called pt. Instructed her to call [facility]'s office for results since we have not seen/evaluated pt.  Offered to make an appt and pt did not respond to that. She just stated that she would call the other office.

## 2018-12-06 LAB — LIPID PROFILE (EXT)
Chol/HDL Ratio (EXT): 3.4 (ref <4.5)
Cholesterol (EXT): 226 mg/dL — ABNORMAL HIGH (ref 25–199)
HDL Cholesterol (EXT): 67 mg/dL (ref 35–135)
LDL Cholesterol (EXT): 137 mg/dL — ABNORMAL HIGH (ref <130)
NON HDL Cholesterol (EXT): 159 mg/dL
Triglycerides (EXT): 130 mg/dL (ref 10–150)

## 2018-12-19 ENCOUNTER — Ambulatory Visit: Payer: Medicare Other | Admitting: Psychology

## 2018-12-20 ENCOUNTER — Ambulatory Visit: Payer: Medicare Other | Admitting: Psychology

## 2019-07-12 ENCOUNTER — Emergency Department (HOSPITAL_COMMUNITY)
Admission: EM | Admit: 2019-07-12 | Discharge: 2019-07-12 | Disposition: A | Discharge status: Home / Self Care | Payer: Medicare Other

## 2020-03-02 ENCOUNTER — Other Ambulatory Visit: Payer: Self-pay | Admitting: Endocrinology

## 2020-03-02 DIAGNOSIS — Z1231 Encounter for screening mammogram for malignant neoplasm of breast: Secondary | ICD-10-CM

## 2020-03-11 ENCOUNTER — Telehealth: Payer: Self-pay

## 2020-03-11 NOTE — Telephone Encounter (Signed)
Tried calling patient to schedule new patient pulmonary consult per Dr Patrecia Pace office for possible sleep apnea, no vmail and unable to reach pt several times. Joy Jimenez

## 2020-03-19 ENCOUNTER — Other Ambulatory Visit: Payer: Self-pay

## 2020-03-19 ENCOUNTER — Other Ambulatory Visit: Payer: Medicare Other

## 2020-03-20 ENCOUNTER — Other Ambulatory Visit: Payer: Medicare Other

## 2020-04-15 ENCOUNTER — Ambulatory Visit
Admission: RE | Admit: 2020-04-15 | Discharge: 2020-04-15 | Disposition: A | Discharge status: Home / Self Care | Payer: Medicare Other | Source: Ambulatory Visit | Attending: Endocrinology | Admitting: Endocrinology

## 2020-04-15 ENCOUNTER — Other Ambulatory Visit: Payer: Self-pay

## 2020-04-15 DIAGNOSIS — Z1231 Encounter for screening mammogram for malignant neoplasm of breast: Secondary | ICD-10-CM

## 2020-04-21 ENCOUNTER — Other Ambulatory Visit: Payer: Self-pay | Admitting: *Deleted

## 2020-04-21 ENCOUNTER — Inpatient Hospital Stay
Admission: RE | Admit: 2020-04-21 | Discharge: 2020-04-21 | Disposition: A | Discharge status: Home / Self Care | Payer: Self-pay | Source: Ambulatory Visit | Attending: *Deleted | Admitting: *Deleted

## 2020-04-21 DIAGNOSIS — Z1231 Encounter for screening mammogram for malignant neoplasm of breast: Secondary | ICD-10-CM

## 2020-04-30 ENCOUNTER — Ambulatory Visit (INDEPENDENT_AMBULATORY_CARE_PROVIDER_SITE_OTHER): Payer: Medicare Other | Admitting: Internal Medicine

## 2020-04-30 ENCOUNTER — Other Ambulatory Visit: Payer: Self-pay

## 2020-04-30 ENCOUNTER — Encounter: Payer: Self-pay | Admitting: Internal Medicine

## 2020-04-30 VITALS — BP 130/70 | HR 86 | Temp 97.8°F | Resp 16 | Ht 63.0 in | Wt 197.0 lb

## 2020-04-30 DIAGNOSIS — G459 Transient cerebral ischemic attack, unspecified: Secondary | ICD-10-CM

## 2020-04-30 DIAGNOSIS — I749 Embolism and thrombosis of unspecified artery: Secondary | ICD-10-CM

## 2020-04-30 DIAGNOSIS — Z7189 Other specified counseling: Secondary | ICD-10-CM | POA: Diagnosis not present

## 2020-04-30 DIAGNOSIS — G4733 Obstructive sleep apnea (adult) (pediatric): Secondary | ICD-10-CM | POA: Diagnosis not present

## 2020-04-30 NOTE — Progress Notes (Signed)
[facility] [place], [place] [address]  Pulmonary Sleep Medicine   Office Visit Note  Patient Name: Joy Jimenez DOB: [birth year] MRN [id number]  Date of Service: 04/30/2020  Complaints/HPI: OSA Patient has a history of OSA diagnosed in 2018 with significant sleep disordered breathing.  The patient has had issues with some increased symptoms she has been little bit more tired than usual.  She denies having any headaches.  There is no history of any atypical nocturnal movements related to sleep.  Sleep study results are noted as below where she was found to have mild obstructive sleep apnea with an index of 9.8 and oxygen desaturations down to 89%.  She is now here for further evaluation and recommendations on management.  She has no parasomnias noted no restlessness of her legs noted.  She denies anything that would be consistent with   Patient Name: Joy Jimenez, Joy Jimenez Date: 10/18/2016 Gender: Female D.O.B: 1945/11/22 Age (years): 75 Referring Provider: Chesley Mires MD, ABSM Height (inches): 63 Interpreting Physician: Chesley Mires MD, ABSM Weight (lbs): 204 RPSGT: Madelon Lips BMI: 36 MRN: [id number] Neck Size: 14.00  CLINICAL INFORMATION Sleep Study Type: NPSG  Indication for sleep study: OSA  Epworth Sleepiness Score:  SLEEP STUDY TECHNIQUE As per the AASM Manual for the Scoring of Sleep and Associated Events v2.3 (April 2016) with a hypopnea requiring 4% desaturations.  The channels recorded and monitored were frontal, central and occipital EEG, electrooculogram (EOG), submentalis EMG (chin), nasal and oral airflow, thoracic and abdominal wall motion, anterior tibialis EMG, snore microphone, electrocardiogram, and pulse oximetry.  MEDICATIONS Medications self-administered by patient taken the night of the study : N/A  SLEEP ARCHITECTURE The study was initiated at 10:47:27 PM and ended at 4:51:13 AM.  Sleep onset time was 66.3  minutes and the sleep efficiency was 33.8%. The total sleep time was 123.0 minutes.  Stage REM latency was N/A minutes.  The patient spent 20.33% of the night in stage N1 sleep, 79.67% in stage N2 sleep, 0.00% in stage N3 and 0.00% in REM.  Alpha intrusion was absent.  Supine sleep was 0.00%.  RESPIRATORY PARAMETERS The overall respiratory disturbance index (RDI) was 9.8 per hour. There were 1 total apneas, including 0 obstructive, 1 central and 0 mixed apneas. There were 6 hypopneas and 13 RERAs.  The AHI during Stage REM sleep was N/A per hour.  AHI while supine was N/A per hour.  The mean oxygen saturation was 91.88%. The minimum SpO2 during sleep was 89.00%.  Moderate snoring was noted during this study.  CARDIAC DATA The 2 lead EKG demonstrated sinus rhythm. The mean heart rate was 58.29 beats per minute. Other EKG findings include: None.  LEG MOVEMENT DATA The total PLMS were 0 with a resulting PLMS index of 0.00. Associated arousal with leg movement index was 0.0 .  IMPRESSIONS - This study showed mild sleep apnea with an RDI of 9.8 and SpO2 low of 89%.  DIAGNOSIS - Sleep apnea, unspecified (G47.30)  RECOMMENDATIONS - Additional therapies include weight loss, CPAP, oral appliance, or surgical assessment.  [Electronically signed] 11/02/2016 01:45 PM  Chesley Mires MD, ABSM Diplomate, American Board of Sleep Medicine  ROS  General: (-) fever, (-) chills, (-) night sweats, (-) weakness Skin: (-) rashes, (-) itching,. Eyes: (-) visual changes, (-) redness, (-) itching. Nose and Sinuses: (-) nasal stuffiness or itchiness, (-) postnasal drip, (-) nosebleeds, (-) sinus trouble. Mouth and Throat: (-) sore throat, (-) hoarseness. Neck: (-) swollen glands, (-)  enlarged thyroid, (-) neck pain. Respiratory: - cough, (-) bloody sputum, - shortness of breath, - wheezing. Cardiovascular: - ankle swelling, (-) chest pain. Lymphatic: (-) lymph node  enlargement. Neurologic: (-) numbness, (-) tingling. Psychiatric: (-) anxiety, (-) depression   Current Medication: Outpatient Encounter Medications as of 04/30/2020  Medication Sig  . acetaminophen (TYLENOL) 650 MG CR tablet Take 1,300 mg by mouth every 8 (eight) hours as needed for pain.  Marland Kitchen allopurinol (ZYLOPRIM) 300 MG tablet Take by mouth.  . Ascorbic Acid (VITAMIN C PO) Take 1 tablet by mouth daily.  Marland Kitchen ascorbic acid (VITAMIN C) 500 MG tablet Take by mouth.  Marland Kitchen aspirin 81 MG EC tablet Take by mouth.  . Baclofen 5 MG TABS Take 5 mg by mouth 2 (two) times daily as needed.  . Cholecalciferol (VITAMIN D3) 3000 UNITS TABS Take by mouth daily.   . cyanocobalamin 1000 MCG tablet Take 500 mcg by mouth daily. Take two tabs daily  . dexlansoprazole (DEXILANT) 60 MG capsule Take 60 mg by mouth daily.  Marland Kitchen escitalopram (LEXAPRO) 20 MG tablet Take 20 mg by mouth daily.   Marland Kitchen levothyroxine (SYNTHROID) 75 MCG tablet Take 75 mcg by mouth daily before breakfast.  . rosuvastatin (CRESTOR) 10 MG tablet Take 10 mg by mouth daily.   No facility-administered encounter medications on file as of 04/30/2020.    Surgical History: Past Surgical History:  Procedure Laterality Date  . CHOLECYSTECTOMY  06/06/83  . TONSILLECTOMY     75 years old    Medical History: Past Medical History:  Diagnosis Date  . Achilles tendinitis   . Depression   . Diverticulosis   . GERD (gastroesophageal reflux disease)   . Heart murmur   . History of chicken pox   . Hyperlipidemia   . Low back pain   . Mini stroke ([place]) 01/14/08  . Mitral valve disorder 2007   "mitral valve leakage"  . Neuromuscular disorder ([place]) 2007   hiatal hernia  . Orthostatic hypotension     Family History: Family History  Problem Relation Age of Onset  . Alcohol abuse Mother   . Heart disease Mother   . Stroke Mother   . Hypertension Mother   . Depression Mother   . Heart disease Father   . Coronary artery disease Father   . Depression  Father   . Depression Sister   . Hypertension Sister   . Alcohol abuse Sister   . Cancer Maternal Aunt        breast  . Hypertension Maternal Aunt   . Cancer Cousin        breast  . Thyroid disease Neg Hx     Social History: Social History   Socioeconomic History  . Marital status: Divorced    Spouse name: Not on file  . Number of children: 1  . Years of education: Not on file  . Highest education level: Not on file  Occupational History  . Occupation: retired  Tobacco Use  . Smoking status: Never Smoker  . Smokeless tobacco: Never Used  Vaping Use  . Vaping Use: Never used  Substance and Sexual Activity  . Alcohol use: No    Comment: once or twice a year - small amount  . Drug use: No  . Sexual activity: Never  Other Topics Concern  . Not on file  Social History Narrative   Caffeine use:  3 beverages a week   Regular exercise:  No   Forensic psychologist  Divorced   Chartered certified accountant- retired, does some Freight forwarder on the side.   Has a daughter who lives in [place].   3 sisters- live locally.      Social Determinants of Health   Financial Resource Strain: Not on file  Food Insecurity: Not on file  Transportation Needs: Not on file  Physical Activity: Not on file  Stress: Not on file  Social Connections: Not on file  Intimate Partner Violence: Not on file    Vital Signs: Blood pressure 130/70, pulse 86, temperature 97.8 F (36.6 C), resp. rate 16, height 5\' 3"  (1.6 m), weight 197 lb (89.4 kg), SpO2 96 %.  Examination: General Appearance: The patient is well-developed, well-nourished, and in no distress. Skin: Gross inspection of skin unremarkable. Head: normocephalic, no gross deformities. Eyes: no gross deformities noted. ENT: ears appear grossly normal no exudates. Neck: Supple. No thyromegaly. No LAD. Respiratory: no rhonchi noted at this time. Cardiovascular: Normal S1 and S2 without murmur or rub. Extremities: No cyanosis. pulses are  equal. Neurologic: Alert and oriented. No involuntary movements.  LABS: No results found for this or any previous visit (from the past 2160 hour(s)).  Radiology: No results found.  No results found.  MM 3D SCREEN BREAST BILATERAL  Result Date: 04/21/2020 CLINICAL DATA:  Screening. EXAM: DIGITAL SCREENING BILATERAL MAMMOGRAM WITH TOMOSYNTHESIS AND CAD TECHNIQUE: Bilateral screening digital craniocaudal and mediolateral oblique mammograms were obtained. Bilateral screening digital breast tomosynthesis was performed. The images were evaluated with computer-aided detection. COMPARISON:  Previous exam(s). ACR Breast Density Category b: There are scattered areas of fibroglandular density. FINDINGS: There are no findings suspicious for malignancy. IMPRESSION: No mammographic evidence of malignancy. A result letter of this screening mammogram will be mailed directly to the patient. RECOMMENDATION: Screening mammogram in one year. (Code:SM-B-01Y) BI-RADS CATEGORY  1: Negative. Electronically Signed   By: Lajean Manes M.D.   On: 04/21/2020 10:49   MM Outside Films Mammo  Result Date: 04/23/2020 This examination belongs to an outside facility and is stored here for comparison purposes only.  Contact the originating outside institution for any associated report or interpretation.  MM Outside Films Mammo  Result Date: 04/23/2020 This examination belongs to an outside facility and is stored here for comparison purposes only.  Contact the originating outside institution for any associated report or interpretation.     Assessment and Plan: Patient Active Problem List   Diagnosis Date Noted  . Fall 12/23/2016  . Thyroid nodule 12/08/2016  . Vaginal bleeding 11/24/2016  . Increased urinary frequency 11/24/2016  . Bilateral low back pain without sciatica 08/17/2016  . Healthcare maintenance 08/17/2016  . Hypothyroidism 06/01/2016  . Osteoarthritis 06/01/2016  . Hx of gout 06/01/2016  . Sleep  apnea, unspecified 10/09/2015  . 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 Tension headache 12/13/2012  . History of TIA (transient ischemic attack) 12/13/2012  . Microcalcifications of the breast 09/23/2011  . Orthostatic hypotension 04/12/2011  . Depression 04/12/2011  . Hyperlipidemia 04/12/2011  . Hiatal hernia with gastroesophageal reflux 04/12/2011    1. OSA (obstructive sleep apnea) Encourage compliance with recommended therapy on the CPAP.  I will review her downloads once these are available to me and will make appropriate recommendations.  2. Obesity, morbid ([place]) Obesity Counseling: Had a lengthy discussion regarding patients BMI and weight issues. Patient was instructed on portion control as well as increased activity. Also discussed caloric restrictions with trying to maintain intake less than 2000 Kcal. Discussions were made in accordance with the 5As of  weight management. Simple actions such as not eating late and if able to, taking a walk is suggested.   3. CPAP use counseling CPAP Counseling: had a lengthy discussion with the patient regarding the importance of PAP therapy in management of the sleep apnea. Patient appears to understand the risk factor reduction and also understands the risks associated with untreated sleep apnea. Patient will try to make a good faith effort to remain compliant with therapy. Also instructed the patient on proper cleaning of the device including the water must be changed daily if possible and use of distilled water is preferred. Patient understands that the machine should be regularly cleaned with appropriate recommended cleaning solutions that do not damage the PAP machine for example given white vinegar and water rinses. Other methods such as ozone treatment may not be as good as these simple methods to achieve cleaning.   4. 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  Explained to her that she is at increased  risk of developing cardiovascular complications in the presence of obstructive sleep apnea and she appears to understand this.  General Counseling: I have discussed the findings of the evaluation and examination with Yareliz.  I have also discussed any further diagnostic evaluation thatmay be needed or ordered today. Loie verbalizes understanding of the findings of todays visit. We also reviewed her medications today and discussed drug interactions and side effects including but not limited excessive drowsiness and altered mental states. We also discussed that there is always a risk not just to her but also people around her. she has been encouraged to call the office with any questions or concerns that should arise related to todays visit.  No orders of the defined types were placed in this encounter.    Time spent: 36  I have personally obtained a history, examined the patient, evaluated laboratory and imaging results, formulated the assessment and plan and placed orders.    Allyne Gee, MD [facility] Pulmonary and Critical Care Sleep medicine

## 2020-04-30 NOTE — Patient Instructions (Signed)

## 2020-05-01 ENCOUNTER — Telehealth: Payer: Self-pay

## 2020-05-01 NOTE — Telephone Encounter (Signed)
Per patient request she will be switching dme company from [employer] to [place] home patient, Joy Jimenez

## 2020-05-07 NOTE — Progress Notes (Signed)
Tracie Turner    New Hair Loss    History of Present Illness  This is a 75 y.o. female who presents for evaluation of hair loss. This has been going on for 3 years. Started on the frontal and vertex scalp. The hair loss happened slowly over time. She denies any recent surgery. She reports that she has a hx of thyroid problems that started in 2021 and take medication for this. She is not aware of any thyroid issues with her labs. Overall, she is healthy. Does have some labs in the chart showing mild anemia (2021) and nl Thyroid hormones (2020)   She reports that she has tried shea butter shampoo and this may have helped a bit. Washing hair every week or every other week.   No itching or pain in the scalp.   Grandfather was bald.     Past Medical History:  Active Ambulatory Problems     Diagnosis Date Noted   . Orthostatic hypotension    . Hypothyroidism    . Mild mitral regurgitation    . Obesity, Class II, BMI 35-39.9    . Sleep apnea    . Pulmonary hypertension (HCC)    . Chronic kidney disease, stage 3 (HCC)    . Depression    . Ventral incisional hernia 03/19/2018   . Vitamin D deficiency 10/19/2018   . Thyroid nodule 12/08/2016   . Tension headache 12/13/2012   . Hiatal hernia 10/19/2018   . Osteoarthritis 06/01/2016   . Microcalcifications of the breast 09/23/2011   . Hyperlipidemia 04/12/2011   . Hx of gout 06/01/2016   . History of TIA (transient ischemic attack) 12/13/2012   . Gastroesophageal reflux disease 10/19/2018   . Hiatal hernia with gastroesophageal reflux 04/12/2011   . Fatigue 10/19/2018   . Exertional shortness of breath 05/14/2015   . 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 Cobalamin deficiency 10/19/2018   . Bilateral low back pain without sciatica 08/17/2016   . Anxiety 10/19/2018   . Pneumococcal vaccination declined 03/23/2019   . Influenza vaccination declined 03/23/2019     Resolved Ambulatory Problems     Diagnosis Date Noted   . Postmenopausal bleeding 10/19/2018     Past Medical  History:   Diagnosis Date   . GERD with stricture      Family History:  Family History   Problem Relation Age of Onset   . Stroke Mother    . Heart failure Father    . Cervical cancer Daughter    . Breast cancer Maternal Aunt          Social History:  Social History     Socioeconomic History   . Marital status: Divorced     Spouse name: Not on file   . Number of children: Not on file   . Years of education: Not on file   . Highest education level: Not on file   Occupational History   . Occupation: Retired   Tobacco Use   . Smoking status: Never Smoker   . Smokeless tobacco: Never Used   Substance and Sexual Activity   . Alcohol use: Not Currently   . Drug use: Never   . Sexual activity: Not on file   Other Topics Concern   . Not on file   Social History Narrative   . Not on file     Social Determinants of Health     Financial Resource Strain: Not on file   Food Insecurity: Not  on file   Transportation Needs: Not on file   Physical Activity: Not on file   Stress: Not on file   Social Connections: Not on file   Housing Stability: Not on file       Review of systems is negative for any other lumps, bumps or other dermatologic conditions of concern. She denies any recent fevers or chills.         Objective:  .vs  She is well nourished and well developed and alert and oriented X3.  Physical Examination of the scalp, hair, face including eyebrow, eyelids, conjunctiva, nose, lips, ears, neck, chest, and exposed areas of bilateral upper extremities including fingernails is significant for:    There is diffuse thinning over the vertex and frontal scalp. There is short intermediate, vellus and short terminal hairs in the affected thinning area. No evidence of scarring, scaling, erythema. Normal pull test. Posterior scalp with increased density.   Face and neck clear.               Assessment:  Female Pattern Hair Loss, Ludwig stage 2    Plan:  1. Discussed diagnoses and treatment options.  2. She will begin topical minoxidil  5% daily to the scalp. Given her overall health, recommend she hold off on any systemic treatments for now.   3. Side effects discussed and patient recommended to read package insert of all medications.   RTC in 9-10 months     Electronically signed by: Basilio Cairo, MD 05/07/2020 10:30 AM        Electronically signed by: Basilio Cairo, MD  05/07/20 2213

## 2020-05-07 NOTE — Patient Instructions (Signed)
Patient Information to take home:    We hope you enjoyed your visit today with our department.     We have instructed you on how to use your medications in order to achieve maximum efficacy.     We recommend using your medications consistently and as prescribed.     If you had any lesions evaluated today, and they change or do not resolve as expected, please call our office and have those lesions re-evaluated.     Please call our clinic at [phone] with any questions or possible complications of your skin condition or treatment.     Rogaine (minoxidil) 5% solution or foam once daily (cheapest at [employer] or [employer])    Try the Rogaine (minoxidil) foam 5% for men (generic is fine) every other night or nightly in a 5 line approach. Use a flat plastic disc to apply to the vertex (crown of the) scalp. May have irritation or itching, and can hold the treatment and use antidandruff shampoofor this. If shedding occurs, it may last for 2-4 weeks while the hair growth synchronizes.

## 2020-10-29 ENCOUNTER — Ambulatory Visit: Payer: Medicare Other | Admitting: Internal Medicine

## 2020-10-29 DIAGNOSIS — Z0289 Encounter for other administrative examinations: Secondary | ICD-10-CM

## 2021-01-13 ENCOUNTER — Emergency Department: Payer: Medicare Other

## 2021-01-13 ENCOUNTER — Emergency Department
Admission: EM | Admit: 2021-01-13 | Discharge: 2021-01-13 | Disposition: A | Discharge status: Home / Self Care | Payer: Medicare Other | Attending: Emergency Medicine | Admitting: Emergency Medicine

## 2021-01-13 DIAGNOSIS — Z7982 Long term (current) use of aspirin: Secondary | ICD-10-CM | POA: Diagnosis not present

## 2021-01-13 DIAGNOSIS — Z79899 Other long term (current) drug therapy: Secondary | ICD-10-CM | POA: Insufficient documentation

## 2021-01-13 DIAGNOSIS — S01311A Laceration without foreign body of right ear, initial encounter: Secondary | ICD-10-CM | POA: Diagnosis not present

## 2021-01-13 DIAGNOSIS — E039 Hypothyroidism, unspecified: Secondary | ICD-10-CM | POA: Diagnosis not present

## 2021-01-13 DIAGNOSIS — W19XXXA Unspecified fall, initial encounter: Secondary | ICD-10-CM

## 2021-01-13 DIAGNOSIS — W01198A Fall on same level from slipping, tripping and stumbling with subsequent striking against other object, initial encounter: Secondary | ICD-10-CM | POA: Insufficient documentation

## 2021-01-13 DIAGNOSIS — S0990XA Unspecified injury of head, initial encounter: Secondary | ICD-10-CM | POA: Diagnosis present

## 2021-01-13 NOTE — ED Provider Notes (Signed)
[facility] Emergency Department Provider Note   ____________________________________________   Event Date/Time   First MD Initiated Contact with Patient 01/13/21 1435     (approximate)  I have reviewed the triage vital signs and the nursing notes.   HISTORY  Chief Complaint Fall    HPI Joy Jimenez is a 75 y.o. female with past medical history of hyperlipidemia, GERD, and hypothyroidism who presents to the ED complaining of fall.  Patient reports that around 430 this morning she had gotten up in the dark and tripped over the wheel of her walker.  She reports hitting the right side of her head on the floor, denies losing consciousness.  She noticed a small amount of bleeding from her right ear and complains of pain around her right ear.  She additionally reports a "pulling" sensation in her neck when she turns it a certain way.  She denies any pain in her chest, abdomen, or extremities.  She has been ambulatory without difficulty since the fall.  She does not take any blood thinners and denies any numbness or weakness.        Past Medical History:  Diagnosis Date   Achilles tendinitis    Depression    Diverticulosis    GERD (gastroesophageal reflux disease)    Heart murmur    History of chicken pox    Hyperlipidemia    Low back pain    Mini stroke 01/14/08   Mitral valve disorder 2007   "mitral valve leakage"   Neuromuscular disorder ([place]) 2007   hiatal hernia   Orthostatic hypotension     Patient Active Problem List   Diagnosis Date Noted   Fall 12/23/2016   Thyroid nodule 12/08/2016   Vaginal bleeding 11/24/2016   Increased urinary frequency 11/24/2016   Bilateral low back pain without sciatica 08/17/2016   Healthcare maintenance 08/17/2016   Hypothyroidism 06/01/2016   Osteoarthritis 06/01/2016   Hx of gout 06/01/2016   Sleep apnea, unspecified 10/09/2015   Dysphagia l[place]oria 07/30/2015   Tension headache 12/13/2012   History  of TIA (transient ischemic attack) 12/13/2012   Microcalcifications of the breast 09/23/2011   Orthostatic hypotension 04/12/2011   Depression 04/12/2011   Hyperlipidemia 04/12/2011   Hiatal hernia with gastroesophageal reflux 04/12/2011    Past Surgical History:  Procedure Laterality Date   CHOLECYSTECTOMY  06/06/83   TONSILLECTOMY     75 years old    Prior to Admission medications   Medication Sig Start Date End Date Taking? Authorizing Provider  acetaminophen (TYLENOL) 650 MG CR tablet Take 1,300 mg by mouth every 8 (eight) hours as needed for pain.    [provider]  allopurinol (ZYLOPRIM) 300 MG tablet Take by mouth. 02/24/20   [provider]  Ascorbic Acid (VITAMIN C PO) Take 1 tablet by mouth daily.    [provider]  ascorbic acid (VITAMIN C) 500 MG tablet Take by mouth.    [provider]  aspirin 81 MG EC tablet Take by mouth.    [provider]  Baclofen 5 MG TABS Take 5 mg by mouth 2 (two) times daily as needed. 08/15/16   Mikell, Jeani Sow, MD  Cholecalciferol (VITAMIN D3) 3000 UNITS TABS Take by mouth daily.     [provider]  cyanocobalamin 1000 MCG tablet Take 500 mcg by mouth daily. Take two tabs daily    [provider]  dexlansoprazole (DEXILANT) 60 MG capsule Take 60 mg by mouth daily.  [provider]  escitalopram (LEXAPRO) 20 MG tablet Take 20 mg by mouth daily.  12/13/12   Garvin Fila, MD  levothyroxine (SYNTHROID) 75 MCG tablet Take 75 mcg by mouth daily before breakfast.    [provider]  rosuvastatin (CRESTOR) 10 MG tablet Take 10 mg by mouth daily.    [provider]    Allergies Risperdal [risperidone] and Tegretol [carbamazepine]  Family History  Problem Relation Age of Onset   Alcohol abuse Mother    Heart disease Mother    Stroke Mother    Hypertension Mother    Depression Mother    Heart disease Father    Coronary artery disease Father     Depression Father    Depression Sister    Hypertension Sister    Alcohol abuse Sister    Cancer Maternal Aunt        breast   Hypertension Maternal Aunt    Cancer Cousin        breast   Thyroid disease Neg Hx     Social History Social History   Tobacco Use   Smoking status: Never   Smokeless tobacco: Never  Vaping Use   Vaping Use: Never used  Substance Use Topics   Alcohol use: No    Comment: once or twice a year - small amount   Drug use: No    Review of Systems  Constitutional: No fever/chills Eyes: No visual changes. ENT: No sore throat.  Positive for right ear pain. Cardiovascular: Denies chest pain. Respiratory: Denies shortness of breath. Gastrointestinal: No abdominal pain.  No nausea, no vomiting.  No diarrhea.  No constipation. Genitourinary: Negative for dysuria. Musculoskeletal: Negative for back pain. Skin: Negative for rash. Neurological: Negative for headaches, focal weakness or numbness.  ____________________________________________   PHYSICAL EXAM:  VITAL SIGNS: ED Triage Vitals  Enc Vitals Group     BP 01/13/21 1329 (!) 133/106     Pulse Rate 01/13/21 1329 64     Resp 01/13/21 1329 20     Temp 01/13/21 1329 97.8 F (36.6 C)     Temp Source 01/13/21 1329 Oral     SpO2 01/13/21 1329 97 %     Weight 01/13/21 1326 199 lb (90.3 kg)     Height 01/13/21 1326 5\' 3"  (1.6 m)     Head Circumference --      Peak Flow --      Pain Score 01/13/21 1326 3     Pain Loc --      Pain Edu? --      Excl. in [place]? --     Constitutional: Alert and oriented. Eyes: Conjunctivae are normal. Head: Atraumatic. Ears: TMs clear bilaterally with no perforation or hemotympanum.  Small superficial laceration to the lower lobe of right ear with no active bleeding. Nose: No congestion/rhinnorhea. Mouth/Throat: Mucous membranes are moist. Neck: Normal ROM, no midline cervical spine tenderness to palpation. Cardiovascular: Normal rate, regular rhythm. Grossly normal  heart sounds. Respiratory: Normal respiratory effort.  No retractions. Lungs CTAB. Gastrointestinal: Soft and nontender. No distention. Genitourinary: deferred Musculoskeletal: No lower extremity tenderness nor edema. Neurologic:  Normal speech and language. No gross focal neurologic deficits are appreciated. Skin:  Skin is warm, dry and intact. No rash noted. Psychiatric: Mood and affect are normal. Speech and behavior are normal.  ____________________________________________   LABS (all labs ordered are listed, but only abnormal results are displayed)  Labs Reviewed - No data to display   PROCEDURES  Procedure(s)  performed (including Critical Care):  Procedures   ____________________________________________   INITIAL IMPRESSION / ASSESSMENT AND PLAN / ED COURSE      75 year old female with past medical history of hyperlipidemia, GERD, and hypothyroidism who presents to the ED complaining of trip and fall this morning around 530, striking the right side of her head with resulting right ear pain.  She has a small superficial laceration to her ear with no active bleeding, TM is clear with no perforation or hemotympanum.  CT head is negative for acute process, we will also check CT of cervical spine given her advanced age.  CT cervical spine is negative for acute process, patient is appropriate for discharge home with PCP follow-up.  She was counseled to return to the ED for new worsening symptoms, patient agrees with plan.      ____________________________________________   FINAL CLINICAL IMPRESSION(S) / ED DIAGNOSES  Final diagnoses:  Fall, initial encounter  Injury of head, initial encounter  Laceration of right ear lobe, initial encounter     ED Discharge Orders     None        Note:  This document was prepared using Dragon voice recognition software and may include unintentional dictation errors.    Blake Divine, MD 01/13/21 [phone]

## 2021-01-13 NOTE — ED Triage Notes (Signed)
Pt to ED POV for fall this morning at 0430. Pt reports it was dark when got up to use restroom and tripped over walker hitting right side of head. Denies LOC. No blood thinner use Blood noted to right ear lobe, no blood noted to ear canal. Pt c.o pain to right ear and states ear feels swollen

## 2021-04-07 ENCOUNTER — Ambulatory Visit: Payer: Medicare Other | Attending: Specialist

## 2021-04-07 DIAGNOSIS — G4761 Periodic limb movement disorder: Secondary | ICD-10-CM | POA: Diagnosis not present

## 2021-04-07 DIAGNOSIS — G4733 Obstructive sleep apnea (adult) (pediatric): Secondary | ICD-10-CM | POA: Insufficient documentation

## 2021-04-09 ENCOUNTER — Other Ambulatory Visit: Payer: Self-pay

## 2021-10-01 ENCOUNTER — Ambulatory Visit (INDEPENDENT_AMBULATORY_CARE_PROVIDER_SITE_OTHER): Payer: Medicare Other | Admitting: Acute Care

## 2021-10-01 ENCOUNTER — Encounter: Payer: Self-pay | Admitting: Acute Care

## 2021-10-01 VITALS — BP 112/78 | HR 56 | Temp 97.8°F | Ht 63.0 in | Wt 203.0 lb

## 2021-10-01 DIAGNOSIS — G4733 Obstructive sleep apnea (adult) (pediatric): Secondary | ICD-10-CM

## 2021-10-01 NOTE — Progress Notes (Signed)
History of Present Illness Joy Jimenez is a 76 y.o. female with history of OSA and previous CPAP use. She has been on CPAP previously, but wants to restart therapy. She had a sleep study done 04/2021.  She is here for a sleep consult.    10/01/2021 Pt. Presents for sleep consult for known OSA. She has been on CPAP in the past, but had trouble getting masks and supplies from [place]. She would like to resume therapy, and establish with the practice.She has had a break in therapy.  She falls asleep frequently throughout the day. Awakens frequently at night to go to the bathroom, getting on average 4-5 hours of sleep per night. Her PCP is referring her to urology for that issue. She sometimes sleeps until 3 pm. She does take  an antidepressant. She is constantly fatigued. She does have a CPAP. She would like to keep her CPAP machine as she owns it. She did not bring it today. She will call the office with her serial number and manufacturer. She states she has frequent awakenings where she jerks awake gasping . She lives with her sister. She states she has been on CPAP wince 2017. She does have swollen ankles today on evaluation .   She is a retired Probation officer She does have one child who is deceased She has recent history of dyspnea at rest, irregular heartbeats, Acid heartburn, Abdominal Pain, Difficulty swallowing, Tooth and dental problems , Headaches, anxiety and depression  She states she walks for her activity  Test Results: Sleep questionnaire Symptoms-  Patient has sleep apnea, no current symptoms  Prior sleep study-  Yes 04/08/2021 Bedtime- 11pm-12 am Time to fall asleep- 2-3 hours Nocturnal awakenings- 3 or more Out of bed/start of day- 3 pm Weight changes- 200-204 pounds Do you operate heavy machinery- No Do you currently wear CPAP- No Do you current wear oxygen- No [place]-  8     10/01/2021   10:00 AM 09/19/2016    2:00 PM  Results of the [place] flowsheet  Sitting and  reading 2 0  Watching TV 0 0  Sitting, inactive in a public place (e.g. a theatre or a meeting) 0 0  As a passenger in a car for an hour without a break 1 0  Lying down to rest in the afternoon when circumstances permit 3 3  Sitting and talking to someone 0 0  Sitting quietly after a lunch without alcohol 2 0  In a car, while stopped for a few minutes in traffic 0 0  Total score 8 3             Latest Ref Rng & Units 06/01/2016    3:18 PM 10/30/2015    8:08 AM 03/16/2015   11:59 AM  CBC  WBC 3.4 - 10.8 x10E3/uL 6.6  8.1  7.1   Hemoglobin 11.1 - 15.9 g/dL 12.9  12.3  12.1   Hematocrit 34.0 - 46.6 % 39.6  38.0  37.9   Platelets 150 - 379 x10E3/uL 251  441  277        Latest Ref Rng & Units 06/01/2016    3:18 PM 10/30/2015    8:08 AM 03/16/2015   11:59 AM  BMP  Glucose 65 - 99 mg/dL 92  94  160   BUN 8 - 27 mg/dL '17  16  23   '$ Creatinine 0.57 - 1.00 mg/dL 1.06  1.02  1.28   BUN/Creat Ratio 12 - 28 16  Sodium 134 - 144 mmol/L 143  136  141   Potassium 3.5 - 5.2 mmol/L 4.3  3.8  3.7   Chloride 96 - 106 mmol/L 102  101  103   CO2 18 - 29 mmol/L '21  24  25   '$ Calcium 8.7 - 10.3 mg/dL 9.4  9.6  9.5     BNP No results found for: "BNP"  ProBNP No results found for: "PROBNP"  PFT No results found for: "FEV1PRE", "FEV1POST", "FVCPRE", "FVCPOST", "TLC", "DLCOUNC", "PREFEV1FVCRT", "PSTFEV1FVCRT"  No results found.   Past medical hx Past Medical History:  Diagnosis Date   Achilles tendinitis    Depression    Diverticulosis    GERD (gastroesophageal reflux disease)    Heart murmur    History of chicken pox    Hyperlipidemia    Low back pain    Mini stroke 01/14/08   Mitral valve disorder 2007   "mitral valve leakage"   Neuromuscular disorder ([place]) 2007   hiatal hernia   Orthostatic hypotension      Social History   Tobacco Use   Smoking status: Never   Smokeless tobacco: Never  Vaping Use   Vaping Use: Never used  Substance Use Topics   Alcohol use: No     Comment: once or twice a year - small amount   Drug use: No    Ms.Lando reports that she has never smoked. She has never used smokeless tobacco. She reports that she does not drink alcohol and does not use drugs.  Tobacco Cessation: Never smoker   Past surgical hx, Family hx, Social hx all reviewed.  Current Outpatient Medications on File Prior to Visit  Medication Sig   acetaminophen (TYLENOL) 650 MG CR tablet Take 1,300 mg by mouth every 8 (eight) hours as needed for pain.   allopurinol (ZYLOPRIM) 300 MG tablet Take by mouth.   Ascorbic Acid (VITAMIN C PO) Take 1 tablet by mouth daily.   ascorbic acid (VITAMIN C) 500 MG tablet Take by mouth.   aspirin 81 MG EC tablet Take by mouth.   Cholecalciferol (VITAMIN D3) 3000 UNITS TABS Take by mouth daily.    cyanocobalamin 1000 MCG tablet Take 500 mcg by mouth daily. Take two tabs daily   dexlansoprazole (DEXILANT) 60 MG capsule Take 60 mg by mouth daily.   escitalopram (LEXAPRO) 20 MG tablet Take 20 mg by mouth daily.    levothyroxine (SYNTHROID) 75 MCG tablet Take 75 mcg by mouth daily before breakfast.   rosuvastatin (CRESTOR) 10 MG tablet Take 10 mg by mouth daily.   No current facility-administered medications on file prior to visit.     Allergies  Allergen Reactions   Risperdal [Risperidone] Other (See Comments)    Blurred Vision    Tegretol [Carbamazepine] Other (See Comments)    Blurred vision    Review Of Systems:  Constitutional:   No  weight loss, night sweats,  Fevers, chills,++ fatigue, or  lassitude.  HEENT:   No headaches,  Difficulty swallowing,  Tooth/dental problems, or  Sore throat,                No sneezing, itching, ear ache, nasal congestion, post nasal drip,   CV:  No chest pain,  Orthopnea, PND, ++ swelling in lower extremities, anasarca, dizziness, palpitations, syncope.   GI  No heartburn, ++ indigestion, abdominal pain, nausea, vomiting, diarrhea, change in bowel habits, loss of appetite, bloody  stools.   Resp: + shortness of breath with exertion or  at rest.  No excess mucus, no productive cough,  No non-productive cough,  No coughing up of blood.  No change in color of mucus.  No wheezing.  No chest wall deformity  Skin: no rash or lesions.  GU: no dysuria, change in color of urine, no urgency or frequency.  No flank pain, no hematuria   MS:  + joint pain or swelling.  No decreased range of motion.  No back pain.  Psych:  No change in mood or affect. No depression or anxiety.  No memory loss.   Vital Signs BP 112/78 (BP Location: Right Arm, Cuff Size: Large)   Pulse (!) 56   Temp 97.8 F (36.6 C) (Oral)   Ht '5\' 3"'$  (1.6 m)   Wt 203 lb (92.1 kg)   SpO2 97%   BMI 35.96 kg/m    Physical Exam:  General- No distress,  A&Ox3 ENT: No sinus tenderness, TM clear, pale nasal mucosa, no oral exudate,no post nasal drip, no LAN Cardiac: S1, S2, regular rate and rhythm, no murmur Chest: No wheeze/ rales/ dullness; no accessory muscle use, no nasal flaring, no sternal retractions Abd.: Soft Non-tender, ND, BS +, Body mass index is 35.96 kg/m.  Ext: No clubbing cyanosis, ++ BLE edema Neuro:  normal strength, MAE x 4 Skin: No rashes, warm and dry Psych: normal mood and behavior   Assessment/Plan OSA Owns her own machine from previous CPAP therpay, and would like to continue to use her machine Plan Please send [name] the serial number of your CPAP machine and the type.  Call [phone] and ask for Triage so you can let them know the information. They will get it to [facility] my nurse.  Enroll in [place] We will set you up with a new DME.( She does not want to use [employer]) We will place orders for CPAP therapy auto set 5-15 cm H20. Mask of choice , and all equipment needs. She needs a mask fitting at the sleep center. Continue on CPAP at bedtime. You appear to be benefiting from the treatment  Goal is to wear for at least 6 hours each night for maximal clinical benefit. Continue  to work on weight loss, as the link between excess weight  and sleep apnea is well established.   Remember to establish a good bedtime routine, and work on sleep hygiene.  Limit daytime naps , avoid stimulants such as caffeine and nicotine close to bedtime, exercise daily to promote sleep quality, avoid heavy , spicy, fried , or rich foods before bed. Ensure adequate exposure to natural light during the day,establish a relaxing bedtime routine with a pleasant sleep environment ( Bedroom between 60 and 67 degrees, turn off bright lights , TV or device screens screens , consider black out curtains or white noise machines) Do not drive if sleepy. Remember to clean mask, tubing, filter, and reservoir once weekly with soapy water.  Follow up with 12 weeks with Judson Roch NP  for down load to evaluate effectiveness of therapy   I spent 45 minutes dedicated to the care of this patient on the date of this encounter to include pre-visit review of records, face-to-face time with the patient discussing conditions above, post visit ordering of testing, clinical documentation with the electronic health record, making appropriate referrals as documented, and communicating necessary information to the patient's healthcare team.    Magdalen Spatz, NP 10/01/2021  10:20 AM

## 2021-10-01 NOTE — Patient Instructions (Addendum)
It is good to see you today. Please send [name] the serial number of your CPAP machine and the type.  Call [phone] and ask for Triage so you can let them know the information. They will get it to [facility] my nurse.  Enroll in [place] We will set you up with a new DME.( She does not want to use [facility]) We will place orders for CPAP therapy auto set 5-15 cm H20. Mask of choice , and all equipment needs. She needs a mask fitting at the sleep center. Continue on CPAP at bedtime. You appear to be benefiting from the treatment  Goal is to wear for at least 6 hours each night for maximal clinical benefit. Continue to work on weight loss, as the link between excess weight  and sleep apnea is well established.   Remember to establish a good bedtime routine, and work on sleep hygiene.  Limit daytime naps , avoid stimulants such as caffeine and nicotine close to bedtime, exercise daily to promote sleep quality, avoid heavy , spicy, fried , or rich foods before bed. Ensure adequate exposure to natural light during the day,establish a relaxing bedtime routine with a pleasant sleep environment ( Bedroom between 60 and 67 degrees, turn off bright lights , TV or device screens screens , consider black out curtains or white noise machines) Do not drive if sleepy. Remember to clean mask, tubing, filter, and reservoir once weekly with soapy water.  Follow up with 12 weeks with Judson Roch NP

## 2021-10-07 NOTE — Progress Notes (Signed)
Reviewed and agree with assessment/plan.   Yanisa Goodgame, MD [place] [facility] 10/07/2021, 9:51 AM Pager:  [phone]  

## 2021-10-28 ENCOUNTER — Ambulatory Visit (INDEPENDENT_AMBULATORY_CARE_PROVIDER_SITE_OTHER): Payer: Medicare Other | Admitting: Student

## 2021-10-28 ENCOUNTER — Encounter: Payer: Self-pay | Admitting: Student

## 2021-10-28 ENCOUNTER — Ambulatory Visit (INDEPENDENT_AMBULATORY_CARE_PROVIDER_SITE_OTHER): Payer: Medicare Other

## 2021-10-28 ENCOUNTER — Encounter: Payer: Self-pay | Admitting: Acute Care

## 2021-10-28 VITALS — BP 102/56 | HR 50 | Ht 63.0 in | Wt 203.0 lb

## 2021-10-28 DIAGNOSIS — R0600 Dyspnea, unspecified: Secondary | ICD-10-CM

## 2021-10-28 DIAGNOSIS — G4730 Sleep apnea, unspecified: Secondary | ICD-10-CM

## 2021-10-28 DIAGNOSIS — E039 Hypothyroidism, unspecified: Secondary | ICD-10-CM | POA: Diagnosis not present

## 2021-10-28 DIAGNOSIS — E7800 Pure hypercholesterolemia, unspecified: Secondary | ICD-10-CM | POA: Insufficient documentation

## 2021-10-28 DIAGNOSIS — N289 Disorder of kidney and ureter, unspecified: Secondary | ICD-10-CM | POA: Insufficient documentation

## 2021-10-28 DIAGNOSIS — E041 Nontoxic single thyroid nodule: Secondary | ICD-10-CM | POA: Insufficient documentation

## 2021-10-28 NOTE — Patient Instructions (Signed)
-   Chest x ray today - If this episodic trouble breathing doesn't clear up with use of CPAP over the next couple of months, please reach out by phone [phone]) or send my chart message if you'd like to arrange an appointment to work this up further, I'll plan to see you in 3 months anyway however

## 2021-11-02 ENCOUNTER — Ambulatory Visit (HOSPITAL_BASED_OUTPATIENT_CLINIC_OR_DEPARTMENT_OTHER): Payer: Medicare Other | Attending: Acute Care

## 2021-11-02 DIAGNOSIS — G4733 Obstructive sleep apnea (adult) (pediatric): Secondary | ICD-10-CM

## 2021-11-04 ENCOUNTER — Telehealth: Payer: Self-pay | Admitting: Student

## 2021-11-04 NOTE — Telephone Encounter (Signed)
Patient stated she is returning a call regarding her x-ray results from Dr. Verlee Monte.  Please call patient back to discuss at [phone].

## 2021-11-05 NOTE — Telephone Encounter (Signed)
Called back and left detailed message with results

## 2021-11-05 NOTE — Telephone Encounter (Signed)
Dr. Verlee Monte, please advise on the results of pt's recent xray.

## 2021-11-23 ENCOUNTER — Ambulatory Visit: Payer: Medicare Other | Admitting: Primary Care

## 2022-01-05 LAB — BMP (EXT)
Anion Gap (EXT): 6 mmol/L (ref 5–14)
BUN (EXT): 26 mg/dL — ABNORMAL HIGH (ref 9–23)
BUN/CREAT Ratio (EXT): 27
CO2 (EXT): 26 mmol/L (ref 20.0–31.0)
CalciumCalcium (EXT): 9.4 mg/dL (ref 8.7–10.4)
Chloride (EXT): 108 mmol/L — ABNORMAL HIGH (ref 98–107)
Creatinine (EXT): 0.95 mg/dL (ref 0.55–1.02)
Glucose (EXT): 129 mg/dL (ref 70–179)
Potassium (EXT): 4.1 mmol/L (ref 3.4–4.8)
Sodium (EXT): 140 mmol/L (ref 135–145)
eGFR - Creat CKD-EPI (EXT): 62 mL/min/{1.73_m2} (ref >=60)

## 2022-01-11 ENCOUNTER — Other Ambulatory Visit: Payer: Self-pay

## 2022-01-11 ENCOUNTER — Emergency Department (HOSPITAL_COMMUNITY)
Admission: EM | Admit: 2022-01-11 | Discharge: 2022-01-12 | Disposition: A | Discharge status: Home / Self Care | Payer: Medicare Other | Attending: Emergency Medicine | Admitting: Emergency Medicine

## 2022-01-11 ENCOUNTER — Emergency Department (HOSPITAL_COMMUNITY): Payer: Medicare Other

## 2022-01-11 DIAGNOSIS — R42 Dizziness and giddiness: Secondary | ICD-10-CM | POA: Diagnosis present

## 2022-01-11 DIAGNOSIS — Z5321 Procedure and treatment not carried out due to patient leaving prior to being seen by health care provider: Secondary | ICD-10-CM | POA: Diagnosis not present

## 2022-01-11 LAB — BASIC METABOLIC PANEL
Anion gap: 11 (ref 5–15)
BUN: 19 mg/dL (ref 8–23)
CO2: 23 mmol/L (ref 22–32)
Calcium: 9.5 mg/dL (ref 8.9–10.3)
Chloride: 105 mmol/L (ref 98–111)
Creatinine, Ser: 0.95 mg/dL (ref 0.44–1.00)
GFR, Estimated: >60 mL/min (ref >60)
Glucose, Bld: 92 mg/dL (ref 70–99)
Potassium: 4.6 mmol/L (ref 3.5–5.1)
Sodium: 139 mmol/L (ref 135–145)

## 2022-01-11 LAB — CBC WITH DIFFERENTIAL/PLATELET
Abs Immature Granulocytes: 0.02 10*3/uL (ref 0.00–0.07)
Basophils Absolute: 0 10*3/uL (ref 0.0–0.1)
Basophils Relative: 1 %
Eosinophils Absolute: 0.2 10*3/uL (ref 0.0–0.5)
Eosinophils Relative: 4 %
HCT: 37.6 % (ref 36.0–46.0)
Hemoglobin: 12 g/dL (ref 12.0–15.0)
Immature Granulocytes: 0 %
Lymphocytes Relative: 39 %
Lymphs Abs: 2.6 10*3/uL (ref 0.7–4.0)
MCH: 31.7 pg (ref 26.0–34.0)
MCHC: 31.9 g/dL (ref 30.0–36.0)
MCV: 99.5 fL (ref 80.0–100.0)
Monocytes Absolute: 0.4 10*3/uL (ref 0.1–1.0)
Monocytes Relative: 6 %
Neutro Abs: 3.3 10*3/uL (ref 1.7–7.7)
Neutrophils Relative %: 50 %
Platelets: 240 10*3/uL (ref 150–400)
RBC: 3.78 MIL/uL — ABNORMAL LOW (ref 3.87–5.11)
RDW: 14.7 % (ref 11.5–15.5)
WBC: 6.7 10*3/uL (ref 4.0–10.5)
nRBC: 0 % (ref 0.0–0.2)
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
## 2022-01-12 NOTE — ED Notes (Signed)
PATIENT HAS GOTTEN MAD DUE WAIT TIME.

## 2022-01-12 NOTE — ED Notes (Signed)
Off duty [employer]pd came to escort patient home

## 2022-01-12 NOTE — ED Notes (Signed)
Patient educated on calling 911 for use of getting home. Patient states she is working it out and they are coming to get her. Explained that they let us know that she is calling and she has to stay to be seen if she wants assistance getting home.

## 2022-01-12 NOTE — ED Notes (Signed)
Patient has been calling rides stating when they get here she is leaving
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
## 2022-01-26 ENCOUNTER — Ambulatory Visit (INDEPENDENT_AMBULATORY_CARE_PROVIDER_SITE_OTHER): Payer: Medicare Other | Admitting: Student

## 2022-01-26 ENCOUNTER — Encounter: Payer: Self-pay | Admitting: Student

## 2022-01-26 VITALS — BP 112/60 | HR 70 | Temp 97.9°F | Ht 63.0 in | Wt 206.0 lb

## 2022-01-26 DIAGNOSIS — R0609 Other forms of dyspnea: Secondary | ICD-10-CM

## 2022-01-26 LAB — BASIC METABOLIC PANEL
BUN: 19 mg/dL (ref 6–23)
CO2: 28 mEq/L (ref 19–32)
Calcium: 9.3 mg/dL (ref 8.4–10.5)
Chloride: 102 mEq/L (ref 96–112)
Creatinine, Ser: 1 mg/dL (ref 0.40–1.20)
GFR: 54.15 mL/min — ABNORMAL LOW (ref >60.00)
Glucose, Bld: 130 mg/dL — ABNORMAL HIGH (ref 70–99)
Potassium: 4.3 mEq/L (ref 3.5–5.1)
Sodium: 136 mEq/L (ref 135–145)

## 2022-01-26 LAB — BRAIN NATRIURETIC PEPTIDE: Pro B Natriuretic peptide (BNP): 87 pg/mL (ref 0.0–100.0)

## 2022-01-26 NOTE — Patient Instructions (Addendum)
-   we will schedule breathing tests next available - labs today - we will have you sign a release to request CT Chest from [facility] but I also encourage you to call [facility] medical records to request a copy of your CT Chest on a CD - this could be either delivered directly to you or you could pick up at medical records at [facility]. Please bring it to next visit. - see you in 8 weeks or sooner to review results

## 2022-02-12 LAB — CMP (EXT)
A/G Ratio (EXT): 1.3 (ref 1.1–2.5)
ALT/SGPT (EXT): 9 U/L (ref 0–40)
AST/SGOT (EXT): 23 U/L (ref 0–40)
Albumin (EXT): 4.1 g/dL (ref 3.5–4.8)
Alkaline Phosphatase (EXT): 91 U/L (ref 25–165)
Anion Gap (EXT): 11 mmol/L (ref 7–16)
BUN (EXT): 22 mg/dL (ref 8–27)
BUN/CREAT Ratio (EXT): 22 (ref 11.0–26.0)
Bilirubin, Total (EXT): 1.17 mg/dL (ref 0.00–1.20)
CO2 (EXT): 27 mmol/L (ref 20–32)
CalciumCalcium (EXT): 10.1 mg/dL (ref 8.6–10.2)
Chloride (EXT): 103 mmol/L (ref 97–108)
Creatinine (EXT): 1 mg/dL (ref 0.57–1.00)
Globulin (EXT): 3.1 g/dL (ref 1.5–4.5)
Glucose (EXT): 138 mg/dL — ABNORMAL HIGH (ref 65–99)
Potassium (EXT): 4.5 mmol/L (ref 3.7–5.4)
Protein (EXT): 7.2 g/dL (ref 6.0–8.5)
Sodium (EXT): 141 mmol/L (ref 136–146)
eGFR - Creat CKD-EPI (EXT): 58 mL/min/{1.73_m2}

## 2022-02-15 NOTE — Telephone Encounter (Signed)
Patient is requesting an appointment with vascular for a possible carotid surgery to be done    Patient states she has plaque blockage in her artery that needs to be removed     Please advise who this patient needs to see and if any vascular studies need to be ordered     Patient is requesting to go to [facility] and would preferably like to see Dr. Elonda Husky or Dr. [place]ene Plater     T# [phone]

## 2022-02-15 NOTE — Progress Notes (Signed)
Archive order entered for imaging from [facility]     1.  12-22-21 Carotid duplex      Electronically signed by: Margrett Rud, RN  02/15/22 1649

## 2022-02-15 NOTE — Progress Notes (Signed)
Archive orders entered for imaging from [facility]     1.  02-12-22 CT Head  2.  02-12-22 CTA Head and Neck      Electronically signed by: Margrett Rud, RN  02/15/22 1642

## 2022-02-17 NOTE — Telephone Encounter (Signed)
Pt said they were returning Cindy's call about making an appt with Dr. [place]ene Plater

## 2022-03-11 ENCOUNTER — Emergency Department: Admit: 2022-03-11 | Payer: MEDICARE

## 2022-03-11 ENCOUNTER — Inpatient Hospital Stay
Admit: 2022-03-11 | Discharge: 2022-03-11 | Disposition: A | Discharge status: Home / Self Care | Payer: MEDICARE | Attending: Emergency Medicine

## 2022-03-11 DIAGNOSIS — S0990XA Unspecified injury of head, initial encounter: Secondary | ICD-10-CM

## 2022-03-11 DIAGNOSIS — S098XXA Other specified injuries of head, initial encounter: Secondary | ICD-10-CM

## 2022-03-11 LAB — CBC WITH DIFFERENTIAL
Basophils %: 0.5 %
Basophils Absolute: 0.03 10*3/uL (ref 0.00–0.22)
Eosinophils %: 4.3 %
Eosinophils Absolute: 0.26 10*3/uL (ref 0.00–0.50)
Hematocrit: 36.6 % (ref 32.0–47.0)
Hemoglobin: 11.9 g/dL (ref 11.0–16.0)
Immature Granulocytes %: 0.7 %
Immature Granulocytes Absolute: 0.04 10*3/uL (ref 0.00–0.10)
Lymphocyte %: 26.4 %
Lymphocytes Absolute: 1.6 10*3/uL (ref 0.70–4.00)
MCH: 31.6 pg (ref 26.0–34.0)
MCHC: 32.5 g/dL (ref 31.0–37.0)
MCV: 97.3 fL (ref 80.0–100.0)
MPV: 10.5 fL (ref 9.1–12.4)
Monocytes %: 7.2 %
Monocytes Absolute: 0.44 10*3/uL (ref 0.36–0.77)
NRBC %: 0 % (ref 0.0–0.0)
NRBC Absolute: 0 10*3/uL (ref 0.00–2.00)
Neutrophil %: 60.9 %
Neutrophils Absolute: 3.7 10*3/uL (ref 1.50–7.95)
Platelets: 242 10*3/uL (ref 150–400)
RBC: 3.76 M/uL (ref 3.70–5.20)
RDW-CV: 14.6 % — ABNORMAL HIGH (ref 11.5–14.5)
RDW-SD: 52.3 fL — ABNORMAL HIGH (ref 35.0–51.0)
WBC: 6.1 10*3/uL (ref 4.0–11.0)

## 2022-03-11 LAB — TYPE AND SCREEN
ABORh: A POS
Antibody Screen: NEGATIVE

## 2022-03-11 LAB — COMPREHENSIVE METABOLIC PANEL
ALT: 11 U/L (ref 0–55)
AST: 36 U/L (ref 6–42)
Albumin: 4.1 g/dL (ref 3.2–5.0)
Alkaline phosphatase: 95 U/L (ref 30–130)
Anion Gap: 10 mmol/L (ref 3–14)
BUN: 26 mg/dL — ABNORMAL HIGH (ref 6–24)
Bilirubin, total: 1.4 mg/dL — ABNORMAL HIGH (ref 0.2–1.2)
CO2 (Bicarbonate): 27 mmol/L (ref 20–32)
Calcium: 9.9 mg/dL (ref 8.5–10.5)
Chloride: 104 mmol/L (ref 98–110)
Creatinine: 1.07 mg/dL (ref 0.55–1.30)
Glucose: 129 mg/dL (ref 70–139)
Potassium: 4.8 mmol/L (ref 3.6–5.2)
Protein, total: 7.5 g/dL (ref 6.0–8.4)
Sodium: 141 mmol/L (ref 135–146)
eGFRcr: 53 mL/min/{1.73_m2} — ABNORMAL LOW (ref >=60)

## 2022-03-11 LAB — SDS, SERUM DRUG SCREEN
Acetaminophen level: <5 ug/mL (ref <=30)
Benzodiazepines screen, serum: NOT DETECTED
Ethanol level, plasma/serum: <10 mg/dL (ref <=10)
Salicylate level: 1 mg/dL (ref 0.0–30.0)
Tricyclics screen, serum: NOT DETECTED mg/dL

## 2022-03-11 LAB — PTT: aPTT: 27 s (ref 25.7–35.7)

## 2022-03-11 LAB — PROTIME-INR
INR: 1.04
Protime: 12 s (ref 9.7–14.0)

## 2022-03-11 MED ORDER — lactated Ringer's bolus 1,000 mL
Freq: Once | INTRAVENOUS | Status: AC
Start: 2022-03-11 — End: 2022-03-11
  Administered 2022-03-11: 20:00:00 1000 mL via INTRAVENOUS

## 2022-03-11 MED ORDER — sodium chloride 0.9 % flush 10 mL
INTRAMUSCULAR | Status: DC | PRN
Start: 2022-03-11 — End: 2022-03-11
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
## 2022-03-11 NOTE — ED Provider Notes (Signed)
EMERGENCY DEPARTMENT ENCOUNTER    ATTENDING NOTE      For the problems addressed, I personally developed, reviewed, and/or approved the plan and assessment as documented by the APP and take responsibility for the plan.        Date of service: 03/11/2022 11:26 AM    Chief Complaint   Patient presents with   . Fall   . Dizziness       Nursing notes reviewed and agreed with unless modified below.    HISTORY OF PRESENT ILLNESS:    Mia Cervantes is a 77 y.o. female who has a history of OSA for which she is noncompliant with CPAP [seen in [place] by pulmonary and cardiology within the past 3 weeks], history of CKD, GERD on Dexilant, hyperlipidemia, orthostatic hypotension, presents to the ED as a walk-in from the hotel across the street reporting a fall yesterday with head injury and also reporting some dizziness with standing.  Patient reports to me that she lives in [place] with her sister but has left in order to pursue a desired endarterectomy at [facility] in [place].  She apparently has been in contact with them and they are waiting to review her medical records which she has brought with her.  It is her anticipation that she will go directly there and get surgery which was denied to her in [place] with details that she does not wish to elaborate on but however it may include that she is not an appropriate candidate for it or it is unnecessary.  While en route by train and stopping through [place] patient reports that she had a trip coming down the steps of the train while disembarking and bumped her head into her walker on the right side.  She is not on any anticoagulation, did not lose consciousness, has no ongoing headache or obvious facial or head injuries.  She reported a separate train and ultimately came into [place] last night, secured to room at [employer] across the street and now is seeking an evaluation.  Reports no neck pain.  Has no paresthesias or  weakness of the extremities.  Reports some lightheadedness with standing recently with borderline blood pressures.  She reports no other injuries from the fall and denies any chest pain palpitations shortness of breath or syncope.    REVIEW OF SYSTEMS    Review of Systems   Constitutional: Negative for fever.   HENT: Negative for sore throat.    Eyes: Negative for redness.   Respiratory: Negative for cough and shortness of breath.    Cardiovascular: Negative for chest pain.   Gastrointestinal: Negative for abdominal pain, blood in stool and vomiting.   Genitourinary: Negative for dysuria.   Musculoskeletal: Negative for back pain.   Skin: Negative for rash.   Neurological: Positive for dizziness. Negative for loss of consciousness and headaches.      PAST MEDICAL HISTORY / PROBLEM LIST    No past medical history on file.    There is no problem list on file for this patient.    PAST SURGICAL HISTORY    No past surgical history on file.    MEDICATIONS     Previous Medications    No medications on file      ALLERGIES    Allergies   Allergen Reactions   . Tegretol [Carbamazepine]      SOCIAL HISTORY    Social History     Tobacco Use   .  Smoking status: Not on file   . Smokeless tobacco: Not on file   Substance Use Topics   . Alcohol use: Not on file     Social History     Substance and Sexual Activity   Drug Use Not on file     FAMILY HISTORY    No family history on file.    PHYSICAL EXAM    ED Triage Vitals [03/11/22 1124]   Temp Pulse Resp BP   35.7 C (96.3 F) 78 18 133/59      SpO2 Temp Source Heart Rate Source Patient Position   96 % Oral -- Sitting      BP Location FiO2 (%)     Left arm --        General: Patient is alert and conversational with clear and fluent speech, overall well-appearing and pleasant, good eye contact, oriented x 3, stable vital signs in triage.  Head: Atraumatic, no palpable tenderness.  Eyes: PERRL, clear conjunctivae.  Full EOMI.  ENT: Moist oral mucosa. Normal posterior OP. Neck: no MLT,  supple, trachea midline, no cervical LAD.   Respiratory: No respiratory distress, clear lungs  CVS: RRR, normal S1 and S2, no murmurs, no chest wall tenderness.   Abdomen: Non-distended, +BS. Soft, non-tender.  Back: No MLT, no CVAT.  Palpable nontender soft nodular irregularity in the lower thoracic and upper lumbar area consistent with a lipoma  Extremities: LE symmetric, no edema.  There is mild tenderness over the anterior right knee but no gross deformity, tolerates range of motion of all extremities without any concerning limitations or instability.  Skin: Normal temperature, brisk capillary refill, no rash.  Neuro: Alert and oriented x3, symmetric cranial nerves, intact strength, normal sensation to light touch    MEDICAL DECISION MAKING & ED COURSE    Pertinent labs and imaging studies reviewed.  77 year old female presenting after a trip with minor head injury while disembarking from a train in [place] en route from her home in [place] all the way to a plan to go to [facility] in [place].  My suspicion for any concerning major head injury is negligible but based on her age CT head was obtained.  Her vitals were stable on arrival but then borderline hypotensive in the ED and with some reports of dizziness and her documented history of orthostatic hypotension in the chart we attempted orthostatics on her but with standing she did report feeling dizzy.  Will plan to give her IV fluids.  Her labs are all reassuring with no AKI or metabolic derangement or anemia.  We obtained x-ray of chest and pelvis and right knee for completion based on her age and her fall which did not show any acute concerning injuries.    I have consulted social work and case management regarding patient safety questions with her plan to travel to [place] for a surgery which has not been established at this point and certainly may not even be indicated.  I overall believe patient is  exercising poor judgment in this plan and it is unclear whether or not there is a possible psychiatric component.  With social work communicating with patient's sister in [place] with whom she lives there does seem to be a prior psychiatric hospitalization 2 years ago where she did receive some IM treatment but the diagnosis remains unclear.  Patient does not seem acutely disorganized or disheveled however still is exercising poor judgment.  Will obtain psychiatry consult,  provide resources if no contraindication to discharge which includes returning to the hotel across the street or to use the hospital nearby with overall reasonable costs around $100 a night.  Patient is making phone calls to her brother who apparently lives in [place] who may be able to come get her and determine if she will be assisted in her trip to [place]    At 1500 hrs. care was transferred to my colleague, Dr. Anola Gurney    EXTERNAL RECORDS REVIEWED  Reviewed office visit for pulmonary and cardiology on November 20 and 22 respectively.  Patient seen in the ED on January 11, 2022 for dizziness for which she had CT head and CTA head and neck as well as CT abdomen and chest.  CTA neck showed atherosclerotic disease without hemodynamically significant arterial stenosis    INDEPENDENT INTERPRETATIONS    EKG    EKG interpreted by me and shows:    NSR at 78 with normal axis, supraventricular bigeminy, QTc 487, no acute ST deviation or T wave inversions.    Cardiac Monitor    Rhythm strip interpreted by me and shows:    NSR at 70    RADIOLOGY    X-rays contemporaneously visualized and interpreted by me:    Pelvis x-ray: No acute fracture.    Right knee: Degenerative changes with no acute fracture    Chest x-ray: Normal mediastinal and cardiac silhouette with no large pleural effusion or focal consolidation    CT head: No ICH  XR PELVIS 1-2 VIEWS   Final Result       No evidence of acute displaced fracture or dislocation.      APPROVED BY STAFF  RADIOLOGIST: Clarisse Gouge 03/11/2022 1:37 PM EST      XR KNEE RIGHT 3 VIEWS   Final Result   Degenerative changes. There is no evidence of acute displaced fracture or dislocation.      APPROVED BY STAFF RADIOLOGIST: Clarisse Gouge 03/11/2022 1:36 PM EST      XR CHEST PORTABLE   Final Result   No acute pulmonary or osseous abnormality.      APPROVED BY STAFF RADIOLOGIST: Bosie Clos, MD 03/11/2022 1:44 PM EST      CT HEAD WO CONTRAST   Final Result      1.  Unremarkable non-contrast head CT.  No acute territorial infarct, space-occupying lesion, or intracranial hemorrhage.    2.  Chronic microangiopathic changes.      APPROVED BY STAFF RADIOLOGIST: Helen Hashimoto, MD 03/11/2022 1:11 PM EST          TESTS/TREATMENT/HOSPITALIZATION CONSIDERED    Labs Reviewed   COMPREHENSIVE METABOLIC PANEL - Abnormal       Result Value    Sodium 141      Potassium 4.8      Chloride 104      CO2 (Bicarbonate) 27      Anion Gap 10      BUN 26 (*)     Creatinine 1.07      eGFRcr 53 (*)     Glucose 129      Fasting? Unknown      Calcium 9.9      AST 36      ALT 11      Alkaline phosphatase 95      Protein, total 7.5      Albumin 4.1      Bilirubin, total 1.4 (*)    CBC WITH DIFFERENTIAL - Abnormal    WBC  6.1      RBC 3.76      Hemoglobin 11.9      Hematocrit 36.6      MCV 97.3      MCH 31.6      MCHC 32.5      RDW-CV 14.6 (*)     RDW-SD 52.3 (*)     Platelets 242      MPV 10.5      Neutrophil % 60.9      Lymphocyte % 26.4      Monocytes % 7.2      Eosinophils % 4.3      Basophils % 0.5      Immature Granulocytes % 0.7      NRBC % 0.0      Neutrophils Absolute 3.70      Lymphocytes Absolute 1.60      Monocytes Absolute 0.44      Eosinophils Absolute 0.26      Basophils Absolute 0.03      Immature Granulocytes Absolute 0.04      NRBC Absolute 0.00     PROTIME-INR - Normal    Protime 12.0      INR 1.04     PTT - Normal    aPTT 27.0     SDS, SERUM DRUG SCREEN - Normal    Acetaminophen level <5      Ethanol level, plasma/serum <10      Salicylate level 1.0       Benzodiazepines screen, serum Not Detected      Tricyclics screen, serum Not Detected     TYPE AND SCREEN    ABORh A POS      Antibody Screen NEG      Specimen Expiration Date 03/14/2022 23:59     URINALYSIS REFLEX TO CULTURE   URINE DRUG SCREEN   POCT GLUCOSE        CHRONIC CONDITIONS  Hyperlipidemia, GERD, OSA    ACUTE CONDITIONS  Minor head injury  Orthostatic hypotension    DISCUSSION WITH OTHER PROVIDERS  Social work and case management regarding legitimacy and concerns for poor judgment with patient's plan of leaving her home in [place] to go to a hospital in [place] where she has no family or friends for which she hopes to be an endarterectomy of unclear clinical indication.  Have consulted psychiatry with consult pending at time care was transferred to my colleague    SOCIAL DETERMINATES OF HEALTH  Lives in [place] with her sister with whom she has recently parted ways.  Has a second sister in [place] for which patient reports they have had a falling out and her not talking.  Has a brother in [place].  Patient on Social Security with some savings but unclear how significant.    Diagnoses as of 03/11/22 1614   Minor head injury, initial encounter   Fall on steps, initial encounter   Orthostatic hypotension     CLINICAL IMPRESSION:    1. Minor head injury, initial encounter    2. Fall on steps, initial encounter    3. Orthostatic hypotension        FINAL DISPOSITION: Care Transferred to ED Attending         Norva Riffle, MD  03/11/22 [phone]

## 2022-03-11 NOTE — ED Progress Note (Addendum)
ED SW, PA and attending MD, and ED CM engaged in huddle to discuss concerns surrounding pt presentation and dispo planning. Care team in agreement with plan for outreach to pt's sister, [name] at [phone] to obtain collateral about pt's baseline and psychiatric hx.     SW engaged in phone outreach to sister, [name], and spoke for 20 minutes. Rena shared the following psychosocial information:    - Patient's nickname is Rosendo Gros   - Pt is the oldest of 6 children   - Pt divorced from ex-husband with loss of 47 y.o daughter 3 1/2 years ago to cervical cancer   - Pt was previously living with Rena prior to most recent trip and is not able to return upon discharge  - Pt with housing voucher in [place] that was provided in Oct/Nov   - Pt with hx of becoming overwhelmed with medical problems and is often upset if treatment is not up to par with her opinion  - Pt has hx of traveling and "running off" especially in the setting of being overtired  - Pt with hx of inpatient psych stay for 3 months 7 to 8 years ago with diagnosis of Borderline Personality Disorder. Received "injections" at this time but is not currently on psychiatric medications.   - Pt with irrational behavior, "doesn't listen," childlike behavior if she doesn't get her way   - Pt with hx of stating "I don't take no for an answer" and often does not listen to others advice/feedback   - Pt is intelligent with hx of obtaining 3 college degrees   - Pt often will book trips at [employer] and utilizes friend, Flow, for access to money during travel for hotels and transportation (until [employer] run out)  - Pt's sister, [name] does not support the pt financially and pt with income of about $1,200 from [employer], food stamps and housing voucher    - Pt's behaviors have escalated over the last year   - Pt's story and plans are ever changing. Pt often tells her sisters different stories.     SW updated attending MD and PA of conversation with pt's sister. SW met with  psychiatry resident and attending and provided above information.  Plan for formal psychiatry consult.     Plan:   - No additional SW needs at this time.  - If patient is cleared by psychiatry, pt can be discharged with cost information for [facility] ($115.29) and [facility] ($109.00) stays.   - SW updated bedside RN. SW placed above information in ED wheel (hotel costs, accommodations guide, connecting with [place] provider, homelessness resource).   - SW updated pt's sister [name] on pending psych and medical recommendations. [name] was provided with [facility] ED phone number for follow up as desired.    Karie Kirks, LICSW [phone]
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
## 2022-03-11 NOTE — ED Notes (Signed)
Pt talking over the phone     Tiney Rouge, RN  03/11/22 1328

## 2022-03-11 NOTE — Discharge Instructions (Signed)
You were seen in the emergency on 03/11/22 after you had a fall yesterday. Your examination, ECG, blood-work, CT head, X-rays did not show fractures, dislocations, or other abnormalities. You saw our psychiatrists who felt that you were safe to be discharged as well. Please follow up with your PCP at home, or establish care here if you wish with the information listed below for further care of your dizziness. Please return to the emergency department if you develop inability to walk, weakness, numbness, chest pain, difficulty breathing, or any other concerning symptoms.

## 2022-03-11 NOTE — ED Notes (Signed)
Pt stated she called her friend to call her sister so her sister will call her brother from [place] to come over and take her stuff at the hotel.     Tiney Rouge, RN  03/11/22 [phone]
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
## 2022-03-11 NOTE — ED Triage Notes (Signed)
Pt brought in by EMS from hotel across the street from [facility]. Pt reporting to ED she had a fall last night in [place] at [place]- took train to [place], states wants an evaluation today for c/o dizziness. +headstrike, denies blood thinners.  For EMS pt ambulatory on scene.     Also c/o R arm, knee, and back pain.

## 2022-03-11 NOTE — ED Notes (Signed)
Received pt from [facility], Pt AAox3, GCs15 denies complaints a of the moment.     Tiney Rouge, RN  03/11/22 1323

## 2022-03-11 NOTE — ED Notes (Signed)
Pt denies complaints while walking using the walker, but reported dizziness when she started lying down with less than 30degree after.     Tiney Rouge, RN  03/11/22 [phone]

## 2022-03-11 NOTE — ED Notes (Signed)
Pt eating dinner at bedside     Tiney Rouge, Walker  03/11/22 1644

## 2022-03-14 ENCOUNTER — Emergency Department: Payer: Medicare Other

## 2022-03-14 ENCOUNTER — Other Ambulatory Visit: Payer: Self-pay

## 2022-03-14 ENCOUNTER — Encounter: Payer: Self-pay | Admitting: Emergency Medicine

## 2022-03-14 ENCOUNTER — Emergency Department
Admission: EM | Admit: 2022-03-14 | Discharge: 2022-03-15 | Disposition: A | Discharge status: Home / Self Care | Payer: Medicare Other | Source: Ambulatory Visit | Attending: Emergency Medicine | Admitting: Emergency Medicine

## 2022-03-14 DIAGNOSIS — Y9289 Other specified places as the place of occurrence of the external cause: Secondary | ICD-10-CM | POA: Insufficient documentation

## 2022-03-14 DIAGNOSIS — Y9389 Activity, other specified: Secondary | ICD-10-CM | POA: Insufficient documentation

## 2022-03-14 DIAGNOSIS — F22 Delusional disorders: Secondary | ICD-10-CM | POA: Insufficient documentation

## 2022-03-14 DIAGNOSIS — H9221 Otorrhagia, right ear: Secondary | ICD-10-CM

## 2022-03-14 DIAGNOSIS — W19XXXA Unspecified fall, initial encounter: Secondary | ICD-10-CM

## 2022-03-14 DIAGNOSIS — M25511 Pain in right shoulder: Secondary | ICD-10-CM

## 2022-03-14 DIAGNOSIS — G44309 Post-traumatic headache, unspecified, not intractable: Secondary | ICD-10-CM

## 2022-03-14 DIAGNOSIS — R519 Headache, unspecified: Secondary | ICD-10-CM | POA: Insufficient documentation

## 2022-03-14 DIAGNOSIS — Y998 Other external cause status: Secondary | ICD-10-CM | POA: Insufficient documentation

## 2022-03-14 DIAGNOSIS — I6782 Cerebral ischemia: Secondary | ICD-10-CM

## 2022-03-14 DIAGNOSIS — M25512 Pain in left shoulder: Secondary | ICD-10-CM

## 2022-03-14 DIAGNOSIS — W01198A Fall on same level from slipping, tripping and stumbling with subsequent striking against other object, initial encounter: Secondary | ICD-10-CM | POA: Insufficient documentation

## 2022-03-14 MED ORDER — ACETAMINOPHEN 325 MG PO TABS *I*
650.0000 mg | ORAL_TABLET | Freq: Once | ORAL | Status: AC
Start: 2022-03-14 — End: 2022-03-14
  Administered 2022-03-14: 650 mg via ORAL
  Filled 2022-03-14: qty 2

## 2022-03-14 NOTE — Progress Notes (Signed)
SW contacted and asked to assist the pt with finding where she is trying to go. Pt is reporting that Garnet Koyanagi was supposed to help her with getting into a local retreat but that staff are unable to get in touch with him and have left voicemail's.     Pt reported that she arrived to [place] today from [place] on an [employer] bus. Pt is from [place] and states that she spoke to Father Nadine Counts who provided her with information about a personal silent retreat to attend. Pt reported that she was given information for both [address] and [place] [place] (two possible locations for the retreat). She stated that she called and left them a voicemail today before she left [place]. Pt confirmed that one of the locations is located at [address] [facility] in [place]. This Clinical research associate was able to confirm that [facility] [phone] is located at [address] in [place] and that they do have silent retreats located there. This writer attempted to call and there was no answer at this location.     Pt reported that she has money but can't spend it on a hotel because she needs the money for the retreat. Pt reported that after the 8-10 day retreat she plans to get a hotel in [place] to get medical treatment in [place] that doctors in [facility] won't assist her with.     Pt is not from [place] and has no where to go. SW will need to try to assist the pt in the morning with contacting her pastor or the retreat.     Perrin Smack, LMSW  Emergency Department Social Worker  [phone]

## 2022-03-14 NOTE — ED Triage Notes (Signed)
Pt arrived to [place] today, recently took a train from [place] to [place] to [place] to [place]. States that 2 days age she fell on the train. C/O generalized pain. From [place] stating she came up to move into a "retreat house with the priests".    Blood Glucose Meter (mg/dl): 161  Prehospital medications given: No

## 2022-03-14 NOTE — ED Provider Progress Notes (Signed)
Tracie Turner is a 76 y.o. female from [place]  Fall 4 days ago while getting onto a train, bleeding from ear and headache   CT head neg     [] SW for housing      Awan, Kahle Mcqueen, MD  Resident  03/14/22 2326

## 2022-03-14 NOTE — ED Provider Progress Notes (Incomplete Revision)
Tracie Turner is a 77 y.o. female from [place]  Fall 4 days ago while getting onto a train, bleeding from ear and headache   CT head neg     []  SW for housing      Burna Forts, MD  Resident  03/14/22 [phone]

## 2022-03-14 NOTE — ED Provider Notes (Incomplete)
History     Chief Complaint   Patient presents with    Tracie Turner is a 77 year old female with history of depression, GERD, TIA, who presents after a fall. The patient states that she is here visiting from [place] to attend a retreat through the [facility]. She states that 4 days prior to presentation she was boarding a train in [address] of [place] and tripped while boarding. She states she slammed her face into her walker and immediately started to notice headache, bleeding from her right ear, and shoulder pain. She did not lose consciousness or vomit. Her symptoms have since resolved although she is still having shoulder pain. She otherwise states that she has intermittently lived in [place] over the years. She reportedly has a contact, "Garnet Koyanagi," within the parish that is helping her arrange a place to stay, although she has been unable to meet him as it is her first day in [place]. She also states that she is here to have carotid artery surgery for ICA stenosis (although per chart review she has been evaluated by a vascular surgeon who did not feel that she needed surgery). She is otherwise denying chest pain, shortness of breath, neck pain, HA, or vision changes at this time.          Medical/Surgical/Family History     History reviewed. No pertinent past medical history.     Patient Active Problem List   Diagnosis Code    Orthostatic hypotension I95.1            History reviewed. No pertinent surgical history.       Social History     Tobacco Use    Smoking status: Never             Review of Systems   All other systems reviewed and are negative.      Physical Exam     Triage Vitals  Triage Start: Start, (03/14/22 1909)  First Recorded BP: 156/68, Resp: 20, Temp: 36.7 C (98.1 F) Oxygen Therapy SpO2: 97 %, O2 Device: None (Room air), Heart Rate: 58, (03/14/22 1913)  .  First Pain Reported  0-10 Scale: 7, Pain Location/Orientation: Generalized;Back,  (03/14/22 1913)       Physical Exam  Constitutional:       General: She is not in acute distress.     Appearance: Normal appearance.   HENT:      Head: Normocephalic and atraumatic.      Right Ear: Ear canal and external ear normal. No hemotympanum. Tympanic membrane is not perforated.      Left Ear: Ear canal and external ear normal. No hemotympanum. Tympanic membrane is not perforated.      Mouth/Throat:      Mouth: Mucous membranes are moist.      Pharynx: Oropharynx is clear.   Eyes:      Extraocular Movements: Extraocular movements intact.      Conjunctiva/sclera: Conjunctivae normal.      Pupils: Pupils are equal, round, and reactive to light.   Cardiovascular:      Rate and Rhythm: Normal rate and regular rhythm.      Pulses: Normal pulses.      Heart sounds: Normal heart sounds. No murmur heard.  Pulmonary:      Effort: Pulmonary effort is normal. No respiratory distress.      Breath sounds: Normal breath sounds.   Abdominal:  General: Abdomen is flat.      Palpations: Abdomen is soft.      Tenderness: There is no abdominal tenderness.   Musculoskeletal:         General: Normal range of motion.      Cervical back: Neck supple. No tenderness.      Comments: Full ROM of shoulders bilaterally. 5/5 deltoid strength bilaterally.   Skin:     General: Skin is warm and dry.      Capillary Refill: Capillary refill takes less than 2 seconds.   Neurological:      Mental Status: She is alert and oriented to person, place, and time.      Cranial Nerves: No cranial nerve deficit.      Sensory: No sensory deficit.      Motor: No weakness.   Psychiatric:         Mood and Affect: Mood normal.         Behavior: Behavior normal.         Thought Content: Thought content is delusional.         Medical Decision Making     Assessment:  Tracie Turner is a 77 year old female with history of depression, GERD, TIA, who presents after a fall 4 days prior to evaluation from ground level onto her walker with +headstrike and  -LOC. Her exam is overall reassuring with no focal neurologic deficits elicited and no evidence of trauma to her bilateral ears. Otherwise her thought content about Garnet Koyanagi and a retreat appears delusional and on chart review she has documented history of delusions. She may have suffered a concussion from the fall, SDH is also possible given her age. Will plan on CTH to evaluate for intracranial abnormality. She otherwise does not currently have a place to live and will require SW assistance to help with placement.    Differential diagnosis:  Concussion  SDH/EDH/SAH/IPH  Delusional disorder    Plan:  Orders Placed This Encounter      CT head without contrast     Meds: Tylenol  Consults: SW    ED Course and Disposition:  The patient was evaluated. Her CTH was reassuring and her vital signs were stable. At the time of sign out, her disposition was pending placement per SW.              [name], MD      Resident Attestation:    Patient seen by me on 03/14/2022.  I saw and evaluated the patient. I agree with the resident's/fellow's findings and plan of care as documented above.    Author:  [name], MD         [name], MD  Resident  03/16/22 1610       [name], MD  03/16/22 [phone]

## 2022-03-15 MED ORDER — ASPIRIN 81 MG PO CHEW *I*
81.0000 mg | CHEWABLE_TABLET | Freq: Every day | ORAL | Status: AC
Start: 2022-03-15 — End: 2022-03-15
  Administered 2022-03-15: 81 mg via ORAL
  Filled 2022-03-15: qty 1

## 2022-03-15 MED ORDER — ESCITALOPRAM OXALATE 20 MG PO TABS *I*
20.0000 mg | ORAL_TABLET | Freq: Every day | ORAL | Status: DC
Start: 2022-03-15 — End: 2022-03-15
  Administered 2022-03-15: 20 mg via ORAL
  Filled 2022-03-15 (×2): qty 1

## 2022-03-15 NOTE — ED Notes (Signed)
Pt belongings returned to pt from [facility] closet.

## 2022-03-15 NOTE — ED Notes (Signed)
Discharge instructions reviewed with pt, pt states understanding of all instructions. Belongings returned to pt, pt dressed in own clothes. Ambulatory with walker prior to d/c. Pt provided medicaid cab per social work for address listed in social work note.

## 2022-03-15 NOTE — Discharge Instructions (Signed)
You were seen today after a fall. Your vital signs, physical exam, and CT head imaging were reassuring. Please follow up with your primary care physician and return to the ER if you have any recurrent falls, worsening pain, confusion, or any new concern.

## 2022-03-15 NOTE — Progress Notes (Signed)
SW received  sign out that pt would like to go to a retreat working with Garnet Koyanagi. SW contacted [facility] [employer] [phone]) speaking with Director Revonda Standard who stated that they do not have any pastor Nadine Counts and the only retreat they have coming up is at the end of January, there is no current retreats. Revonda Standard said that the only place that might have current retreat is [facility] in [place]. SW contacted [facility] [phone]) speaking with Harriett Sine who stated that there is no pastor Nadine Counts and there is no current retreat for women. SW thanked [facility].     SW met with pt who stated that she is working with a friend to get her into a retreat. Patient stated that she was relocating here and her plan was to go to a retreat and then find a place to live.  Patient would not give friend's information and stated that she is awaiting a call back from friend. SW explained that pt was ready for discharge and we need to come up with a plan. Patient asked for a medicab to [address] where her friend works. SW let nursing Annemarie know. SW remain available.     Quentin Angst, LMSW   Emergency Department Social Work   [phone]

## 2022-03-15 NOTE — Progress Notes (Signed)
[facility] Emergency Department Social Work Discharge Note    Contacts:   Patient  EDAS Monique     Destination:  [address] [place]     Transportation:  Mode of Transportation: Taxi  Vendor: Glass blower/designer: [phone]  Transportation pick-up time: ASAP   Payor: Social Work Voucher    Notifications:  Met with patient: Yes  Spoke with family: No: pt declined   Chief Operating Officer (name): EDAS Monique    Need for Nursing Report:  No - N/A    SW contacted by EDAS stating that pt does not have medicaid. SW set up transport for pt.     Quentin Angst, LMSW   Emergency Department Social Work   [phone]

## 2022-09-06 ENCOUNTER — Emergency Department
Admission: EM | Admit: 2022-09-06 | Discharge: 2022-09-06 | Disposition: A | Discharge status: Home / Self Care | Payer: Medicare Other | Attending: Emergency Medicine | Admitting: Emergency Medicine

## 2022-09-06 ENCOUNTER — Emergency Department: Payer: Medicare Other

## 2022-09-06 DIAGNOSIS — S50312A Abrasion of left elbow, initial encounter: Secondary | ICD-10-CM | POA: Insufficient documentation

## 2022-09-06 DIAGNOSIS — W19XXXA Unspecified fall, initial encounter: Secondary | ICD-10-CM

## 2022-09-06 DIAGNOSIS — S30811A Abrasion of abdominal wall, initial encounter: Secondary | ICD-10-CM | POA: Diagnosis not present

## 2022-09-06 DIAGNOSIS — W010XXA Fall on same level from slipping, tripping and stumbling without subsequent striking against object, initial encounter: Secondary | ICD-10-CM | POA: Insufficient documentation

## 2022-09-06 DIAGNOSIS — S80212A Abrasion, left knee, initial encounter: Secondary | ICD-10-CM | POA: Diagnosis not present

## 2022-09-06 DIAGNOSIS — S0081XA Abrasion of other part of head, initial encounter: Secondary | ICD-10-CM | POA: Insufficient documentation

## 2022-09-06 DIAGNOSIS — M79642 Pain in left hand: Secondary | ICD-10-CM

## 2022-09-06 DIAGNOSIS — Y92481 Parking lot as the place of occurrence of the external cause: Secondary | ICD-10-CM | POA: Insufficient documentation

## 2022-09-06 DIAGNOSIS — S60512A Abrasion of left hand, initial encounter: Secondary | ICD-10-CM | POA: Diagnosis not present

## 2022-09-06 DIAGNOSIS — M170 Bilateral primary osteoarthritis of knee: Secondary | ICD-10-CM | POA: Diagnosis not present

## 2022-09-06 DIAGNOSIS — M25461 Effusion, right knee: Secondary | ICD-10-CM | POA: Diagnosis not present

## 2022-09-06 DIAGNOSIS — S3991XA Unspecified injury of abdomen, initial encounter: Secondary | ICD-10-CM | POA: Diagnosis present

## 2022-09-06 DIAGNOSIS — S50311A Abrasion of right elbow, initial encounter: Secondary | ICD-10-CM | POA: Diagnosis not present

## 2022-09-06 DIAGNOSIS — S52121A Displaced fracture of head of right radius, initial encounter for closed fracture: Secondary | ICD-10-CM | POA: Insufficient documentation

## 2022-09-06 DIAGNOSIS — S0990XA Unspecified injury of head, initial encounter: Secondary | ICD-10-CM | POA: Diagnosis not present

## 2022-09-06 DIAGNOSIS — Z23 Encounter for immunization: Secondary | ICD-10-CM | POA: Diagnosis not present

## 2022-09-06 DIAGNOSIS — W101XXA Fall (on)(from) sidewalk curb, initial encounter: Secondary | ICD-10-CM | POA: Diagnosis not present

## 2022-09-06 DIAGNOSIS — S80211A Abrasion, right knee, initial encounter: Secondary | ICD-10-CM | POA: Insufficient documentation

## 2022-09-06 DIAGNOSIS — Y9301 Activity, walking, marching and hiking: Secondary | ICD-10-CM | POA: Insufficient documentation

## 2022-09-06 DIAGNOSIS — M79641 Pain in right hand: Secondary | ICD-10-CM

## 2022-09-06 MED ORDER — ACETAMINOPHEN 325 MG PO TABS
650.0000 mg | ORAL_TABLET | Freq: Once | ORAL | Status: AC
  Administered 2022-09-06: 650 mg via ORAL
  Filled 2022-09-06: qty 2

## 2022-09-06 MED ORDER — TETANUS-DIPHTH-ACELL PERTUSSIS 5-2.5-18.5 LF-MCG/0.5 IM SUSY
0.5000 mL | PREFILLED_SYRINGE | Freq: Once | INTRAMUSCULAR | Status: AC
  Administered 2022-09-06: 0.5 mL via INTRAMUSCULAR
  Filled 2022-09-06: qty 0.5

## 2022-09-06 NOTE — ED Provider Notes (Addendum)
[facility] Provider Note    Event Date/Time   First MD Initiated Contact with Patient 09/06/22 1457     (approximate)   History   Fall   HPI  Joy Jimenez is a 77 y.o. female presents to the emergency department following a fall.  Patient was walking in the parking lot and tripped off of a curb falling forward on her face, hands and knees.  No loss of consciousness.  No preceding chest pain or dizziness.  Called EMS and was transported to the emergency department.  Not on anticoagulation.  Normally ambulates on her own without any difficulties.  Complaining of pain to her face, both of her hands, knees.     Physical Exam   Triage Vital Signs: ED Triage Vitals [09/06/22 1443]  Enc Vitals Group     BP (!) 126/40     Pulse Rate 82     Resp 18     Temp 98.3 F (36.8 C)     Temp Source Oral     SpO2 92 %     Weight      Height      Head Circumference      Peak Flow      Pain Score      Pain Loc      Pain Edu?      Excl. in GC?     Most recent vital signs: Vitals:   09/06/22 1443  BP: (!) 126/40  Pulse: 82  Resp: 18  Temp: 98.3 F (36.8 C)  SpO2: 92%    Physical Exam HENT:     Head:     Comments: Abrasion to the right chin.  Abrasion to the right forehead.  No facial instability.    Right Ear: External ear normal.     Left Ear: External ear normal.     Nose: Nose normal.  Eyes:     Extraocular Movements: Extraocular movements intact.     Pupils: Pupils are equal, round, and reactive to light.  Cardiovascular:     Rate and Rhythm: Normal rate.  Pulmonary:     Effort: No respiratory distress.  Abdominal:     General: There is no distension.  Musculoskeletal:        General: No tenderness. Normal range of motion.     Cervical back: No tenderness.     Comments: No tenderness to bilateral hips.  No midline cervical, thoracic or lumbar tenderness to palpation.  Tenderness to the left hand.  No snuffbox tenderness.  No  significant tenderness to the right hand.  No tenderness to the upper arm, hips or lower legs.  Skin:    Findings: Bruising present.     Comments: Abrasion to bilateral elbows, left dorsal surface of the hand, bilateral knees and the abdominal wall.  No significant hematoma.  Neurological:     General: No focal deficit present.     Mental Status: She is alert.     IMPRESSION / MDM / ASSESSMENT AND PLAN / ED COURSE  I reviewed the triage vital signs and the nursing notes.  On chart review patient is not on anticoagulation  Offered Tylenol however patient declined any pain medication at this time.    RADIOLOGY I independently reviewed imaging, my interpretation of imaging: CT scan of the head without signs of intracranial hemorrhage or infarction.  No signs of skull fracture.  CT scan of the cervical spine with no acute fracture.  X-ray imaging of  bilateral hands and knee with no acute fracture or dislocation  Xr R elbow with concern for radial head fracture, minimally displaced. - displaced fracture to R radial head with joint effusion   LABS (all labs ordered are listed, but only abnormal results are displayed) Labs interpreted as -    Labs Reviewed - No data to display   MDM  Tetanus updated.  Able to ambulate in the emergency department.  No concern for basal skull fracture.  No concern for ligamentous injury do not feel the patient needs an MRI of her neck.  Most likely with musculoskeletal strain.  Discussed symptomatic treatment.  Discussed follow-up with primary care physician and return precautions for any worsening symptoms.  No questions at time of discharge.     PROCEDURES:  Critical Care performed: No  .Splint Application  Date/Time: 09/06/2022 5:27 PM  Performed by: Corena Herter, MD Authorized by: Corena Herter, MD   Consent:    Consent obtained:  Verbal   Consent given by:  Patient   Risks, benefits, and alternatives were discussed: yes     Risks  discussed:  Discoloration, numbness, pain and swelling   Alternatives discussed:  Delayed treatment Universal protocol:    Procedure explained and questions answered to patient or proxy's satisfaction: yes     Relevant documents present and verified: yes     Test results available: yes     Imaging studies available: yes     Required blood products, implants, devices, and special equipment available: yes     Site/side marked: yes     Immediately prior to procedure a time out was called: yes     Patient identity confirmed:  Verbally with patient, arm band and hospital-assigned identification number Pre-procedure details:    Distal neurologic exam:  Normal   Distal perfusion: distal pulses strong   Procedure details:    Location:  Elbow   Elbow location:  R elbow   Cast type:  Long arm   Splint type:  Long arm   Supplies:  Fiberglass   Attestation: Splint applied and adjusted personally by me   Post-procedure details:    Distal neurologic exam:  Normal   Distal perfusion: distal pulses strong     Procedure completion:  Tolerated well, no immediate complications   Patient's presentation is most consistent with acute presentation with potential threat to life or bodily function.   MEDICATIONS ORDERED IN ED: Medications  Tdap (BOOSTRIX) injection 0.5 mL (has no administration in time range)    FINAL CLINICAL IMPRESSION(S) / ED DIAGNOSES   Final diagnoses:  Fall, initial encounter  Injury of head, initial encounter  Hand pain, left  Hand pain, right  Abrasion of abdominal wall, initial encounter  Abrasion of both knees     Rx / DC Orders   ED Discharge Orders     None        Note:  This document was prepared using Dragon voice recognition software and may include unintentional dictation errors.   Corena Herter, MD 09/06/22 1600    Corena Herter, MD 09/06/22 1729

## 2022-09-06 NOTE — ED Triage Notes (Signed)
Coming from a restaurant. Pt had a witnessed mechanical fall, tripped over a curb. Pt fell face first, lac to R side of chin. Denies blood thinners. No deformities noted. Pt c/o pain everywhere. Ems reports redness to ABD. VSS. GCS 15. Walks unassisted @baseline .

## 2022-09-14 ENCOUNTER — Other Ambulatory Visit: Payer: Self-pay | Admitting: Family Medicine

## 2022-09-14 DIAGNOSIS — R1033 Periumbilical pain: Secondary | ICD-10-CM

## 2022-09-14 DIAGNOSIS — S3992XD Unspecified injury of lower back, subsequent encounter: Secondary | ICD-10-CM

## 2022-09-19 ENCOUNTER — Ambulatory Visit
Admission: RE | Admit: 2022-09-19 | Discharge: 2022-09-19 | Disposition: A | Discharge status: Home / Self Care | Payer: Medicare Other | Source: Ambulatory Visit | Attending: Family Medicine | Admitting: Family Medicine

## 2022-09-19 DIAGNOSIS — R1033 Periumbilical pain: Secondary | ICD-10-CM | POA: Diagnosis present

## 2022-09-19 DIAGNOSIS — Z9049 Acquired absence of other specified parts of digestive tract: Secondary | ICD-10-CM | POA: Insufficient documentation

## 2022-09-19 DIAGNOSIS — W19XXXD Unspecified fall, subsequent encounter: Secondary | ICD-10-CM | POA: Insufficient documentation

## 2022-09-19 DIAGNOSIS — S3992XD Unspecified injury of lower back, subsequent encounter: Secondary | ICD-10-CM | POA: Insufficient documentation

## 2022-12-20 ENCOUNTER — Ambulatory Visit (INDEPENDENT_AMBULATORY_CARE_PROVIDER_SITE_OTHER): Payer: Medicare Other | Admitting: Urology

## 2022-12-20 VITALS — BP 114/76 | HR 83 | Ht 63.0 in | Wt 193.0 lb

## 2022-12-20 DIAGNOSIS — R350 Frequency of micturition: Secondary | ICD-10-CM | POA: Diagnosis not present

## 2022-12-20 LAB — MICROSCOPIC EXAMINATION
Epithelial Cells (non renal): >10 /[HPF] — AB (ref 0–10)
WBC, UA: >30 /[HPF] — AB (ref 0–5)

## 2022-12-20 LAB — URINALYSIS, COMPLETE
Bilirubin, UA: NEGATIVE
Glucose, UA: NEGATIVE
Ketones, UA: NEGATIVE
Nitrite, UA: NEGATIVE
Protein,UA: NEGATIVE
RBC, UA: NEGATIVE
Specific Gravity, UA: 1.015 (ref 1.005–1.030)
Urobilinogen, Ur: 0.2 mg/dL (ref 0.2–1.0)
pH, UA: 6 (ref 5.0–7.5)

## 2022-12-20 LAB — BLADDER SCAN AMB NON-IMAGING: Scan Result: 96

## 2022-12-20 NOTE — Progress Notes (Signed)
I,Joy Jimenez,acting as a scribe for Joy Scotland, MD.,have documented all relevant documentation on the behalf of Joy Scotland, MD,as directed by  Joy Scotland, MD while in the presence of Joy Scotland, MD.  12/20/2022 12:37 PM   Joy Jimenez 23-Jan-1946 [id number]  Referring provider: [name], MD [address] [place],  [place] [address]  Chief Complaint  Patient presents with   Establish Care   Urinary Frequency    HPI: 77 year-old female presents today for further evaluation of urinary symptoms.  She is currently managed on Myrbetriq 50 mg.  Her urinalysis today shows greater than 30 WBC's, greater than 10 epithelial cells, many bacteria, no RBC's.   She goes to the bathroom 1-7 times per day. She gets up 3 or more times a night to urinate. When she needs to urinate, the desire is strong. When she does feel this urge she can't get to the bathroom on time. This happens 3 or more times per day. She's not wearing any absorbent undergarments or limits her fluid intake. She sometimes engages in toilet napping.  She reports having a personal history of sleep apnea and does not wear her Cpap machine because "she can't find it". It was misplaced in storage during a move back on 06/21/2019. She recalls that when she used it regularly her urinary symptoms at night were not as bad.  She currently has nocturia x5 and is able to make it to the bathroom in time without an accident.   She states the Myrbetriq is helpful and she doesn't have as many issues during the day.   She has given numerous urine samples at her primary care. Sometimes they show WBC's and bacteria but always have many squamous epithelial cells consistent with contaminated samples. Urine culture's a bit non-clonal. Contributing factors include a personal history of obesity and OSA.  Results for orders placed or performed in visit on 12/20/22  Bladder Scan (Post Void Residual) in office   Result Value Ref Range   Scan Result 96 ml     PMH: Past Medical History:  Diagnosis Date   Achilles tendinitis    Depression    Diverticulosis    GERD (gastroesophageal reflux disease)    Heart murmur    History of chicken pox    Hyperlipidemia    Low back pain    Mini stroke 01/14/08   Mitral valve disorder 2007   "mitral valve leakage"   Neuromuscular disorder (HCC) 2007   hiatal hernia   Orthostatic hypotension     Surgical History: Past Surgical History:  Procedure Laterality Date   CHOLECYSTECTOMY  06/06/83   TONSILLECTOMY     77 years old    Home Medications:  Allergies as of 12/20/2022       Reactions   Risperdal [risperidone] Other (See Comments)   Blurred Vision   Tegretol [carbamazepine] Other (See Comments)   Blurred vision        Medication List        Accurate as of December 20, 2022 12:37 PM. If you have any questions, ask your nurse or doctor.          acetaminophen 650 MG CR tablet Commonly known as: TYLENOL Take 1,300 mg by mouth every 8 (eight) hours as needed for pain.   allopurinol 300 MG tablet Commonly known as: ZYLOPRIM Take by mouth.   aspirin EC 81 MG tablet Take by mouth.   Crestor 5 MG tablet Generic drug: rosuvastatin Take  5 mg by mouth daily. What changed: Another medication with the same name was removed. Continue taking this medication, and follow the directions you see here.   cyanocobalamin 1000 MCG tablet Commonly known as: VITAMIN B12 Take 1,000 mcg by mouth daily.   Dexilant 60 MG capsule Generic drug: dexlansoprazole Take 60 mg by mouth daily.   escitalopram 20 MG tablet Commonly known as: LEXAPRO Take 20 mg by mouth daily. What changed: Another medication with the same name was removed. Continue taking this medication, and follow the directions you see here.   levothyroxine 75 MCG tablet Commonly known as: SYNTHROID Take 75 mcg by mouth daily before breakfast.   Myrbetriq 50 MG Tb24  tablet Generic drug: mirabegron ER Take 50 mg by mouth daily.   VITAMIN C PO Take 1 tablet by mouth daily.   Vitamin D3 1.25 MG (50000 UT) Caps Take by mouth.        Allergies:  Allergies  Allergen Reactions   Risperdal [Risperidone] Other (See Comments)    Blurred Vision    Tegretol [Carbamazepine] Other (See Comments)    Blurred vision    Family History: Family History  Problem Relation Age of Onset   Alcohol abuse Mother    Heart disease Mother    Stroke Mother    Hypertension Mother    Depression Mother    Heart disease Father    Coronary artery disease Father    Depression Father    Depression Sister    Hypertension Sister    Alcohol abuse Sister    Cancer Maternal Aunt        breast   Hypertension Maternal Aunt    Cancer Cousin        breast   Thyroid disease Neg Hx     Social History:  reports that she has never smoked. She has never used smokeless tobacco. She reports that she does not drink alcohol and does not use drugs.   Physical Exam: BP 114/76   Pulse 83   Ht 5\' 3"  (1.6 m)   Wt 193 lb (87.5 kg)   BMI 34.19 kg/m   Constitutional:  Alert and oriented, No acute distress. HEENT:  AT, moist mucus membranes.  Trachea midline, no masses. Neurologic: Grossly intact, no focal deficits, moving all 4 extremities. Psychiatric: Normal mood and affect.  Results for orders placed or performed in visit on 12/20/22  Microscopic Examination   Urine  Result Value Ref Range   WBC, UA >30 (A) 0 - 5 /hpf   RBC, Urine 0-2 0 - 2 /hpf   Epithelial Cells (non renal) >10 (A) 0 - 10 /hpf   Renal Epithel, UA 0-10 (A) None seen /hpf   Mucus, UA Present (A) Not Estab.   Bacteria, UA Many (A) None seen/Few  Urinalysis, Complete  Result Value Ref Range   Specific Gravity, UA 1.015 1.005 - 1.030   pH, UA 6.0 5.0 - 7.5   Color, UA Yellow Yellow   Appearance Ur Hazy (A) Clear   Leukocytes,UA 3+ (A) Negative   Protein,UA Negative Negative/Trace   Glucose, UA  Negative Negative   Ketones, UA Negative Negative   RBC, UA Negative Negative   Bilirubin, UA Negative Negative   Urobilinogen, Ur 0.2 0.2 - 1.0 mg/dL   Nitrite, UA Negative Negative   Microscopic Examination See below:   Bladder Scan (Post Void Residual) in office  Result Value Ref Range   Scan Result 96 ml  Assessment & Plan:    1. Nocturia  - This is her primary concern for today.  - She lost her sleep apnea equipment in a move several years ago. Onset of her nighttime urinary symptoms since that time. Would recommend she try to go back on her CPAP to see if her nighttime urinary symptoms improve. She will reach out to Dr. Sampson Goon regarding getting a new machine. If symptoms don't improve, will address whether or not  to adjust the medications for OAB.   - Daytime symptoms managed by Myrbetriq 50 milligrams, continue this dose. -Urinalysis is grossly contaminated, unable to interpret results.  In the absence of dysuria and other systemic signs of infection, minimal concern for infection.  Return if symptoms worsen or fail to improve.  I have reviewed the above documentation for accuracy and completeness, and I agree with the above.   Joy Scotland, MD   [facility] [address], [address] [place], [place] [address] [phone]

## 2023-01-30 ENCOUNTER — Other Ambulatory Visit: Payer: Self-pay | Admitting: *Deleted

## 2023-01-30 DIAGNOSIS — R0609 Other forms of dyspnea: Secondary | ICD-10-CM

## 2023-01-31 ENCOUNTER — Ambulatory Visit: Payer: Medicare Other | Admitting: Primary Care

## 2023-03-14 ENCOUNTER — Other Ambulatory Visit: Payer: Self-pay | Admitting: Neurology

## 2023-03-14 DIAGNOSIS — Z9181 History of falling: Secondary | ICD-10-CM

## 2023-03-14 DIAGNOSIS — R42 Dizziness and giddiness: Secondary | ICD-10-CM

## 2023-03-14 DIAGNOSIS — R413 Other amnesia: Secondary | ICD-10-CM

## 2023-03-14 DIAGNOSIS — R519 Headache, unspecified: Secondary | ICD-10-CM

## 2023-03-24 ENCOUNTER — Other Ambulatory Visit: Payer: Medicare Other

## 2023-03-28 ENCOUNTER — Ambulatory Visit: Admission: RE | Admit: 2023-03-28 | Payer: Medicare Other | Source: Ambulatory Visit

## 2023-03-30 ENCOUNTER — Ambulatory Visit
Admission: RE | Admit: 2023-03-30 | Discharge: 2023-03-30 | Disposition: A | Discharge status: Home / Self Care | Payer: Medicare Other | Source: Ambulatory Visit | Attending: Neurology | Admitting: Neurology

## 2023-03-30 DIAGNOSIS — R42 Dizziness and giddiness: Secondary | ICD-10-CM | POA: Insufficient documentation

## 2023-03-30 DIAGNOSIS — R413 Other amnesia: Secondary | ICD-10-CM | POA: Insufficient documentation

## 2023-03-30 DIAGNOSIS — Z9181 History of falling: Secondary | ICD-10-CM | POA: Insufficient documentation

## 2023-03-30 DIAGNOSIS — R519 Headache, unspecified: Secondary | ICD-10-CM | POA: Diagnosis present

## 2023-05-09 ENCOUNTER — Other Ambulatory Visit: Payer: Self-pay | Admitting: Orthopedic Surgery

## 2023-05-09 DIAGNOSIS — M4807 Spinal stenosis, lumbosacral region: Secondary | ICD-10-CM

## 2023-05-15 ENCOUNTER — Ambulatory Visit
Admission: RE | Admit: 2023-05-15 | Discharge: 2023-05-15 | Disposition: A | Discharge status: Home / Self Care | Source: Ambulatory Visit | Attending: Orthopedic Surgery | Admitting: Orthopedic Surgery

## 2023-05-15 DIAGNOSIS — M4807 Spinal stenosis, lumbosacral region: Secondary | ICD-10-CM | POA: Insufficient documentation

## 2023-10-05 ENCOUNTER — Other Ambulatory Visit: Payer: Self-pay | Admitting: Internal Medicine

## 2023-10-05 DIAGNOSIS — R42 Dizziness and giddiness: Secondary | ICD-10-CM

## 2023-10-05 DIAGNOSIS — E782 Mixed hyperlipidemia: Secondary | ICD-10-CM

## 2023-10-05 DIAGNOSIS — R0602 Shortness of breath: Secondary | ICD-10-CM

## 2023-10-16 ENCOUNTER — Ambulatory Visit
Admission: RE | Admit: 2023-10-16 | Discharge: 2023-10-16 | Disposition: A | Discharge status: Home / Self Care | Source: Ambulatory Visit | Attending: Internal Medicine | Admitting: Internal Medicine

## 2023-10-16 DIAGNOSIS — R42 Dizziness and giddiness: Secondary | ICD-10-CM | POA: Insufficient documentation

## 2023-10-16 DIAGNOSIS — R0602 Shortness of breath: Secondary | ICD-10-CM | POA: Diagnosis present

## 2023-10-16 DIAGNOSIS — E782 Mixed hyperlipidemia: Secondary | ICD-10-CM | POA: Diagnosis present

## 2023-10-16 MED ORDER — IOHEXOL 350 MG/ML SOLN
75.0000 mL | Freq: Once | INTRAVENOUS | Status: AC | PRN
  Administered 2023-10-16 (×2): 75 mL via INTRAVENOUS

## 8386-11-06 DEATH — deceased
